# Patient Record
Sex: Female | Born: 1955 | Race: White | Hispanic: No | Marital: Married | Smoking: Never smoker
Health system: Southern US, Community
[De-identification: ages and names within clinical notes are randomized; demographics above are authoritative.]

## PROBLEM LIST (undated history)

## (undated) DIAGNOSIS — R600 Localized edema: Secondary | ICD-10-CM

## (undated) DIAGNOSIS — E669 Obesity, unspecified: Secondary | ICD-10-CM

## (undated) DIAGNOSIS — S52122A Displaced fracture of head of left radius, initial encounter for closed fracture: Secondary | ICD-10-CM

## (undated) DIAGNOSIS — L570 Actinic keratosis: Secondary | ICD-10-CM

## (undated) DIAGNOSIS — R0602 Shortness of breath: Secondary | ICD-10-CM

## (undated) DIAGNOSIS — Z8719 Personal history of other diseases of the digestive system: Secondary | ICD-10-CM

## (undated) DIAGNOSIS — S52022A Displaced fracture of olecranon process without intraarticular extension of left ulna, initial encounter for closed fracture: Secondary | ICD-10-CM

## (undated) DIAGNOSIS — M2550 Pain in unspecified joint: Secondary | ICD-10-CM

## (undated) DIAGNOSIS — M549 Dorsalgia, unspecified: Secondary | ICD-10-CM

## (undated) DIAGNOSIS — R002 Palpitations: Secondary | ICD-10-CM

## (undated) DIAGNOSIS — F419 Anxiety disorder, unspecified: Secondary | ICD-10-CM

## (undated) DIAGNOSIS — Z8489 Family history of other specified conditions: Secondary | ICD-10-CM

## (undated) DIAGNOSIS — I10 Essential (primary) hypertension: Secondary | ICD-10-CM

## (undated) DIAGNOSIS — T7840XA Allergy, unspecified, initial encounter: Secondary | ICD-10-CM

## (undated) DIAGNOSIS — M1990 Unspecified osteoarthritis, unspecified site: Secondary | ICD-10-CM

## (undated) DIAGNOSIS — G4730 Sleep apnea, unspecified: Secondary | ICD-10-CM

## (undated) DIAGNOSIS — M1711 Unilateral primary osteoarthritis, right knee: Secondary | ICD-10-CM

## (undated) DIAGNOSIS — K219 Gastro-esophageal reflux disease without esophagitis: Secondary | ICD-10-CM

## (undated) HISTORY — DX: Actinic keratosis: L57.0

## (undated) HISTORY — PX: CHOLECYSTECTOMY: SHX55

## (undated) HISTORY — DX: Obesity, unspecified: E66.9

## (undated) HISTORY — DX: Essential (primary) hypertension: I10

## (undated) HISTORY — DX: Pain in unspecified joint: M25.50

## (undated) HISTORY — DX: Allergy, unspecified, initial encounter: T78.40XA

## (undated) HISTORY — PX: TUBAL LIGATION: SHX77

## (undated) HISTORY — PX: BREAST SURGERY: SHX581

## (undated) HISTORY — PX: ABDOMINAL HYSTERECTOMY: SHX81

## (undated) HISTORY — DX: Localized edema: R60.0

## (undated) HISTORY — PX: BREAST BIOPSY: SHX20

## (undated) HISTORY — PX: COLONOSCOPY: SHX174

## (undated) HISTORY — DX: Gastro-esophageal reflux disease without esophagitis: K21.9

## (undated) HISTORY — DX: Shortness of breath: R06.02

## (undated) HISTORY — DX: Anxiety disorder, unspecified: F41.9

## (undated) HISTORY — DX: Dorsalgia, unspecified: M54.9

---

## 2001-11-06 ENCOUNTER — Encounter: Admission: RE | Admit: 2001-11-06 | Discharge: 2001-11-06 | Payer: Self-pay | Admitting: Urology

## 2001-11-06 ENCOUNTER — Encounter: Payer: Self-pay | Admitting: Urology

## 2001-11-21 HISTORY — PX: BREAST EXCISIONAL BIOPSY: SUR124

## 2004-10-26 ENCOUNTER — Ambulatory Visit: Payer: Self-pay | Admitting: General Surgery

## 2004-10-27 ENCOUNTER — Ambulatory Visit: Payer: Self-pay | Admitting: Family Medicine

## 2004-11-03 ENCOUNTER — Ambulatory Visit: Payer: Self-pay | Admitting: Family Medicine

## 2005-02-22 ENCOUNTER — Ambulatory Visit: Payer: Self-pay | Admitting: Family Medicine

## 2005-06-28 ENCOUNTER — Ambulatory Visit: Payer: Self-pay | Admitting: Family Medicine

## 2005-07-14 ENCOUNTER — Ambulatory Visit: Payer: Self-pay | Admitting: Family Medicine

## 2005-07-15 ENCOUNTER — Ambulatory Visit: Payer: Self-pay | Admitting: Family Medicine

## 2005-07-19 ENCOUNTER — Ambulatory Visit: Payer: Self-pay | Admitting: Family Medicine

## 2005-10-21 HISTORY — PX: UPPER GASTROINTESTINAL ENDOSCOPY: SHX188

## 2005-11-01 ENCOUNTER — Ambulatory Visit: Payer: Self-pay | Admitting: Internal Medicine

## 2005-11-17 ENCOUNTER — Ambulatory Visit: Payer: Self-pay | Admitting: Internal Medicine

## 2005-11-17 ENCOUNTER — Encounter (INDEPENDENT_AMBULATORY_CARE_PROVIDER_SITE_OTHER): Payer: Self-pay | Admitting: Specialist

## 2005-11-21 ENCOUNTER — Encounter (INDEPENDENT_AMBULATORY_CARE_PROVIDER_SITE_OTHER): Payer: Self-pay | Admitting: Internal Medicine

## 2005-11-21 LAB — CONVERTED CEMR LAB: Pap Smear: NORMAL

## 2005-12-29 ENCOUNTER — Ambulatory Visit: Payer: Self-pay | Admitting: Internal Medicine

## 2006-01-03 ENCOUNTER — Ambulatory Visit: Payer: Self-pay | Admitting: General Surgery

## 2006-01-25 ENCOUNTER — Ambulatory Visit: Payer: Self-pay | Admitting: Family Medicine

## 2006-05-05 ENCOUNTER — Ambulatory Visit: Payer: Self-pay | Admitting: Internal Medicine

## 2007-02-09 ENCOUNTER — Encounter (INDEPENDENT_AMBULATORY_CARE_PROVIDER_SITE_OTHER): Payer: Self-pay | Admitting: Internal Medicine

## 2007-02-09 DIAGNOSIS — M479 Spondylosis, unspecified: Secondary | ICD-10-CM | POA: Insufficient documentation

## 2007-02-12 DIAGNOSIS — I10 Essential (primary) hypertension: Secondary | ICD-10-CM | POA: Insufficient documentation

## 2007-04-13 ENCOUNTER — Telehealth (INDEPENDENT_AMBULATORY_CARE_PROVIDER_SITE_OTHER): Payer: Self-pay | Admitting: Internal Medicine

## 2007-07-12 ENCOUNTER — Encounter (INDEPENDENT_AMBULATORY_CARE_PROVIDER_SITE_OTHER): Payer: Self-pay | Admitting: Internal Medicine

## 2007-08-30 ENCOUNTER — Ambulatory Visit: Payer: Self-pay | Admitting: Family Medicine

## 2007-09-11 ENCOUNTER — Ambulatory Visit: Payer: Self-pay | Admitting: Family Medicine

## 2007-10-16 ENCOUNTER — Telehealth: Payer: Self-pay | Admitting: Family Medicine

## 2007-10-16 ENCOUNTER — Encounter (INDEPENDENT_AMBULATORY_CARE_PROVIDER_SITE_OTHER): Payer: Self-pay | Admitting: *Deleted

## 2007-10-16 ENCOUNTER — Ambulatory Visit: Payer: Self-pay | Admitting: Family Medicine

## 2007-10-16 DIAGNOSIS — K449 Diaphragmatic hernia without obstruction or gangrene: Secondary | ICD-10-CM | POA: Insufficient documentation

## 2008-03-11 ENCOUNTER — Telehealth (INDEPENDENT_AMBULATORY_CARE_PROVIDER_SITE_OTHER): Payer: Self-pay | Admitting: Internal Medicine

## 2008-03-18 ENCOUNTER — Encounter: Payer: Self-pay | Admitting: Family Medicine

## 2008-08-05 ENCOUNTER — Ambulatory Visit: Payer: Self-pay | Admitting: Family Medicine

## 2008-08-05 DIAGNOSIS — L02419 Cutaneous abscess of limb, unspecified: Secondary | ICD-10-CM | POA: Insufficient documentation

## 2008-08-05 DIAGNOSIS — L03119 Cellulitis of unspecified part of limb: Secondary | ICD-10-CM

## 2008-09-02 ENCOUNTER — Ambulatory Visit: Payer: Self-pay | Admitting: Family Medicine

## 2008-10-24 ENCOUNTER — Telehealth (INDEPENDENT_AMBULATORY_CARE_PROVIDER_SITE_OTHER): Payer: Self-pay | Admitting: *Deleted

## 2008-11-04 ENCOUNTER — Ambulatory Visit: Payer: Self-pay | Admitting: Family Medicine

## 2008-11-19 ENCOUNTER — Ambulatory Visit: Payer: Self-pay | Admitting: Family Medicine

## 2008-11-21 ENCOUNTER — Ambulatory Visit: Payer: Self-pay | Admitting: Family Medicine

## 2008-11-24 ENCOUNTER — Ambulatory Visit: Payer: Self-pay | Admitting: Family Medicine

## 2008-11-27 ENCOUNTER — Telehealth: Payer: Self-pay | Admitting: Family Medicine

## 2008-12-01 ENCOUNTER — Telehealth: Payer: Self-pay | Admitting: Family Medicine

## 2008-12-23 ENCOUNTER — Telehealth: Payer: Self-pay | Admitting: Family Medicine

## 2009-02-10 ENCOUNTER — Ambulatory Visit: Payer: Self-pay | Admitting: Family Medicine

## 2009-02-10 DIAGNOSIS — J069 Acute upper respiratory infection, unspecified: Secondary | ICD-10-CM | POA: Insufficient documentation

## 2009-02-25 ENCOUNTER — Encounter: Payer: Self-pay | Admitting: Family Medicine

## 2009-03-06 ENCOUNTER — Telehealth (INDEPENDENT_AMBULATORY_CARE_PROVIDER_SITE_OTHER): Payer: Self-pay | Admitting: Internal Medicine

## 2009-07-15 ENCOUNTER — Telehealth: Payer: Self-pay | Admitting: Family Medicine

## 2009-09-30 ENCOUNTER — Encounter: Payer: Self-pay | Admitting: Family Medicine

## 2010-05-06 ENCOUNTER — Ambulatory Visit: Payer: Self-pay | Admitting: Family Medicine

## 2010-05-10 ENCOUNTER — Ambulatory Visit: Payer: Self-pay | Admitting: Family Medicine

## 2010-05-10 ENCOUNTER — Telehealth: Payer: Self-pay | Admitting: Family Medicine

## 2010-05-12 ENCOUNTER — Ambulatory Visit: Payer: Self-pay | Admitting: Unknown Physician Specialty

## 2010-05-20 ENCOUNTER — Encounter: Payer: Self-pay | Admitting: Family Medicine

## 2010-05-26 ENCOUNTER — Encounter: Payer: Self-pay | Admitting: Family Medicine

## 2010-05-26 DIAGNOSIS — N6009 Solitary cyst of unspecified breast: Secondary | ICD-10-CM

## 2010-06-02 ENCOUNTER — Ambulatory Visit: Payer: Self-pay | Admitting: General Surgery

## 2010-06-23 ENCOUNTER — Encounter (INDEPENDENT_AMBULATORY_CARE_PROVIDER_SITE_OTHER): Payer: Self-pay | Admitting: *Deleted

## 2010-08-26 ENCOUNTER — Ambulatory Visit: Payer: Self-pay | Admitting: Family Medicine

## 2010-08-26 DIAGNOSIS — M62838 Other muscle spasm: Secondary | ICD-10-CM | POA: Insufficient documentation

## 2010-08-26 DIAGNOSIS — H6980 Other specified disorders of Eustachian tube, unspecified ear: Secondary | ICD-10-CM

## 2010-09-30 ENCOUNTER — Telehealth: Payer: Self-pay | Admitting: Family Medicine

## 2010-11-23 ENCOUNTER — Encounter: Payer: Self-pay | Admitting: Internal Medicine

## 2010-12-21 NOTE — Letter (Signed)
Summary: Nadara Eaton letter  Jessie at [facility]  [address] [place], [place] [address]   Phone: [phone]  Fax: [phone]       06/23/2010 MRN: [id number]  AMRAN MALTER [address] [place], [place]  [address]  Dear Ms. [facility], and [facility] announce the retirement of Arta Silence, M.D., from full-time practice at the [facility] office effective May 20, 2010 and his plans of returning part-time.  It is important to Dr. Hetty Ely and to our practice that you understand that [facility] has seven physicians in our office for your health care needs.  We will continue to offer the same exceptional care that you have today.    Dr. Hetty Ely has spoken to many of you about his plans for retirement and returning part-time in the fall.   We will continue to work with you through the transition to schedule appointments for you in the office and meet the high standards that [facility] is committed to.   Again, it is with great pleasure that we share the news that Dr. Hetty Ely will return to [facility] at [facility] in October of 2011 with a reduced schedule.    If you have any questions, or would like to request an appointment with one of our physicians, please call us at [phone] and press the option for Scheduling an appointment.  We take pleasure in providing you with excellent patient care and look forward to seeing you at your next office visit.  Our [facility] Physicians are:  Tillman Abide, M.D. Laurita Quint, M.D. Roxy Manns, M.D. Kerby Nora, M.D. Hannah Beat, M.D. Ruthe Mannan, M.D. We proudly welcomed Raechel Ache, M.D. and Eustaquio Boyden, M.D. to the practice in July/August 2011.  Sincerely,  [facility]

## 2010-12-21 NOTE — Miscellaneous (Signed)
  Clinical Lists Changes  Problems: Added new problem of BREAST CYST (ICD-610.0) - aspiration 2010 per Dr. Lemar Livings
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
## 2010-12-21 NOTE — Progress Notes (Signed)
Summary: Post nasal drip  Phone Note Call from Patient Call back at Home Phone [phone]   Caller: Patient Call For: Ruthe Mannan, M.D. Summary of Call: I am experiencing a lot of post nasal drip (more like flood) since Sunday p.m.  It is causing me to cough.  There is no fever and I don't really feel bad except a little tired.  I think that's probably from my sleep being interrupted with coughing.    My concern is that in the past, this has been the path that usually progresses to laryngitis over a period of time.  I have been taking Allegra 180 mg. since Sunday.  I took some Sudafed yesterday and I think it made it worse.  Do you have any suggestions about what I can do to try to dry up this post-nasal drip so that it doesn't irritate my larynx so bad that it causes laryngitis?  Once I get a bad case of it, it usually takes my voice away for a solid week with gradual improvement after that.  I don't want to be out of work again for laryngitis. Initial call taken by: Delilah Shan CMA Duncan Dull),  September 30, 2010 8:19 AM  Follow-up for Phone Call        I [employer] [facility].  I remember your Laryngitis last time.  You really did have a tough time with it. I like Allegra very much, is it Careers adviser D?  The D component will likely help a lot.  Have you used an inhaled steroid like flonase? Ruthe Mannan MD  September 30, 2010 8:24 AM  It is not Allegra D.  I used some Sudafed yesterday and it seemed to make it worse.  I do have an Astelin NS that was given to me by [facility] before she left.  Should I use it and Allegra D? Esther Teale CMA Duncan Dull)  September 30, 2010 9:42 AM   Additional Follow-up for Phone Call Additional follow up Details #1::        yes that's what I would do. Ruthe Mannan MD  September 30, 2010 9:47 AM  Thank you so much. Additional Follow-up by: Delilah Shan CMA Duncan Dull),  September 30, 2010 9:59 AM

## 2010-12-21 NOTE — Miscellaneous (Signed)
Summary: tdap  Clinical Lists Changes       Tetanus/Td Vaccine    Vaccine Type: Tdap    Site: left deltoid    Mfr: Gl[employer]oSmithKline    Dose: 0.5 ml    Route: IM    Given by: Mervin Hack CMA (AAMA)    Exp. Date: 02/13/2012    Lot #: [id number]    VIS given: 10/09/07 version given May 03, 2010.

## 2010-12-21 NOTE — Progress Notes (Signed)
Summary: Tetanus Td no charge per Employee Health  Phone Note From Other Clinic   Caller: Provider Summary of Call: Employee Health told [name] she needed a Tetanus/Td Vaccine. [name],  administrated the injecttions to [name].  This is a no charge to the employee per [employer].  Need to enter [facility] as a no charge.[name]  May 10, 2010 3:38 PM Initial call taken by: [name],  May 10, 2010 3:38 PM  Follow-up for Phone Call        Request was sent to Dr. Hetty Ely to enter the No Charge.[name]  May 11, 2010 8:57 AM Follow-up by: [name],  May 11, 2010 8:57 AM  Additional Follow-up for Phone Call Additional follow up Details #1::        This was done. Additional Follow-up by: [name] MD,  May 11, 2010 9:05 AM

## 2010-12-21 NOTE — Letter (Signed)
Summary: [facility]  [facility]   Imported By: Lanelle Bal 06/01/2010 09:22:34  _____________________________________________________________________  External Attachment:    Type:   Image     Comment:   External Document

## 2010-12-23 NOTE — Letter (Signed)
Summary: Office Visit Letter  [facility]  [address]   [place], [place] [address]   Phone: [phone]  Fax: [phone]      November 23, 2010 MRN: [id number]   [name] [address] [place], [place]  [address]   Dear [name],   According to our records, it is time for you to schedule a follow-up office visit with [place].   At your convenience, please call [phone] (option #2)to schedule an office visit. If you have any questions, concerns, or feel that this letter is in error, we would appreciate your call.   Sincerely,   Stan Head, M.D.  [facility] [phone]

## 2011-01-31 ENCOUNTER — Ambulatory Visit (INDEPENDENT_AMBULATORY_CARE_PROVIDER_SITE_OTHER): Payer: BC Managed Care – PPO | Admitting: Family Medicine

## 2011-01-31 ENCOUNTER — Encounter: Payer: Self-pay | Admitting: Family Medicine

## 2011-01-31 DIAGNOSIS — H6980 Other specified disorders of Eustachian tube, unspecified ear: Secondary | ICD-10-CM

## 2011-01-31 DIAGNOSIS — H6990 Unspecified Eustachian tube disorder, unspecified ear: Secondary | ICD-10-CM

## 2011-02-08 NOTE — Assessment & Plan Note (Signed)
Summary: Ear problems /lsf   Vital Signs:  Patient profile:   55 year old female Height:      65 inches Temp:     98.7 degrees F oral Pulse rate:   84 / minute Pulse rhythm:   regular Resp:     16 per minute BP sitting:   120 / 74  (right arm) Cuff size:   large  Vitals Entered By: Benny Lennert CMA Alexanderia Gorby Dull) (January 31, 2011 11:03 AM) CC: Ear pain, worse on the right, some post nasal drainage.   History of Present Illness: R ear pain since yesterday.  Better with local heat last night.  Was taking ibuprofen prev.  Didn't use ear drops.  Occ mild, intermittent L ear pain.  No FCNAVD.  Some head congestion- occ flare of this per patient.  No cough.  No wheeze. Hasn't been using nasal saline recently.    Allergies: 1)  ! * Wheat  Review of Systems       See HPI.  Otherwise negative.    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  This is likely causing her symptoms.  Her ear/TM looks fine o/w.  Nasal saline and claritin are reasonable and then consider flonase if not improved after that.  She agrees, follow up as needed.   Complete Medication List: 1)  Atenolol 100 Mg Tabs (Atenolol) .... Take 1 tablet by mouth once a day 2)  Neurontin 300 Mg Caps (Gabapentin) .... Take 1 capsule by mouth three times a day as needed 3)  Prilosec 20 Mg Cpdr (Omeprazole) .... Take 1 tab by mouth at bedtime as needed 4)  Flax Seed Oil 1000 Mg Caps (Flaxseed (linseed)) .... Take 1 capsule by mouth two times a day 5)  Acidophilus 100 Mg Caps (Lactobacillus) .... Take 1 capsule by mouth once a day 6)  Caltrate 600+d 600-400 Mg-unit Tabs (Calcium carbonate-vitamin d) .... Take 1 tablet by mouth two times a day 7)  Adult Aspirin Ec Low Strength  81 Mg Tbec (Aspirin) .... Take 2  tablets  by mouth at bedtime 8)  Vitamin A 10,000 Units  .... Take 1 capsule by mouth at bedtime 9)  B Complex Caps (B complex vitamins) .... Take one capsule each morning. 10)  Bl Vitamin C 500 Mg Tabs (Ascorbic acid) .... Take 1 capsule by mouth at bedtime 11)  Cyclobenzaprine Hcl 10 Mg Tabs (Cyclobenzaprine hcl) .Marland Kitchen.. 1 by mouth 2 times daily as needed for neck pain 12)  Lisinopril-hydrochlorothiazide 20-12.5 Mg Tabs (Lisinopril-hydrochlorothiazide) .... Take 1 tablet by mouth once a day 13)  Vitamin D 1000 Unit Tabs (Cholecalciferol) .... 5,000 units each morning.  Patient Instructions: 1)  I would use nasal saline and 10mg  of claritin a day.  If you aren't getting better, let me know.  Take care.    Orders Added: 1)  Est. Patient Level III [28413]    Current Allergies (reviewed today): ! * WHEAT

## 2011-03-03 ENCOUNTER — Ambulatory Visit (INDEPENDENT_AMBULATORY_CARE_PROVIDER_SITE_OTHER): Payer: BC Managed Care – PPO | Admitting: Family Medicine

## 2011-03-03 ENCOUNTER — Encounter: Payer: Self-pay | Admitting: Family Medicine

## 2011-03-03 VITALS — BP 130/82 | HR 76 | Temp 97.9°F | Resp 16 | Ht 65.0 in

## 2011-03-03 DIAGNOSIS — N390 Urinary tract infection, site not specified: Secondary | ICD-10-CM

## 2011-03-03 DIAGNOSIS — R309 Painful micturition, unspecified: Secondary | ICD-10-CM

## 2011-03-03 LAB — POCT URINALYSIS DIPSTICK
Bilirubin, UA: NEGATIVE
Glucose, UA: NEGATIVE
Ketones, UA: NEGATIVE
Nitrite, UA: POSITIVE
Protein, UA: NEGATIVE
Spec Grav, UA: 1.025
Urobilinogen, UA: NEGATIVE
pH, UA: 6

## 2011-03-03 MED ORDER — CIPROFLOXACIN HCL 500 MG PO TABS: 250.0000 mg | ORAL_TABLET | Freq: Two times a day (BID) | ORAL | Status: AC

## 2011-03-03 NOTE — Progress Notes (Signed)
SUBJECTIVE: [name] is a 55 y.o. female who complains of urinary frequency, urgency and dysuria x 1 days, without flank pain, fever, chills, or abnormal vaginal discharge or bleeding.   OBJECTIVE: Appears well, in no apparent distress.  Vital signs are normal. The abdomen is soft without tenderness, guarding, mass, rebound or organomegaly. No CVA tenderness or inguinal adenopathy noted. Urine dipstick shows positive for nitrates and positive for leukocytes.  Micro exam: not done.   ASSESSMENT: UTI uncomplicated without evidence of pyelonephritis  PLAN: Treatment per orders - also push fluids, may use Pyridium OTC prn. Call or return to clinic prn if these symptoms worsen or fail to improve as anticipated.

## 2011-03-09 ENCOUNTER — Ambulatory Visit: Payer: BC Managed Care – PPO | Admitting: Family Medicine

## 2011-03-28 ENCOUNTER — Encounter: Payer: Self-pay | Admitting: Family Medicine

## 2011-03-28 ENCOUNTER — Ambulatory Visit (INDEPENDENT_AMBULATORY_CARE_PROVIDER_SITE_OTHER): Payer: BC Managed Care – PPO | Admitting: Family Medicine

## 2011-03-28 VITALS — BP 142/82 | HR 80 | Temp 98.5°F

## 2011-03-28 DIAGNOSIS — L0390 Cellulitis, unspecified: Secondary | ICD-10-CM

## 2011-03-28 DIAGNOSIS — L0291 Cutaneous abscess, unspecified: Secondary | ICD-10-CM

## 2011-03-28 MED ORDER — SULFAMETHOXAZOLE-TRIMETHOPRIM 800-160 MG PO TABS: 1.0000 | ORAL_TABLET | Freq: Two times a day (BID) | ORAL | Status: AC

## 2011-03-28 MED ORDER — FLUCONAZOLE 150 MG PO TABS: 150.0000 mg | ORAL_TABLET | Freq: Once | ORAL | Status: AC

## 2011-03-28 NOTE — Progress Notes (Signed)
55 yo with 2-3 days of abscess in right groin area.  First noticed it Saturday morning. No erythema, redness or pain. Placed warm compresses it on it and took warm baths, was able to express approximately a thimble full of clear/bloody fluid from it in the shower that day.  Still not noticing any erythema and it only "stings" a little. No fevers, chills, nausea or body aches.  Never had an abscess like this before but did have a small pimple on her labia recently.  Noticed that there is a firm area below this abscess.  The PMH, PSH, Social History, Family History, Medications, and allergies have been reviewed in [facility], and have been updated if relevant.  ROS: See HPI Patient reports no vision/ hearing  changes, adenopathy,fever, weight change,  persistant / recurrent hoarseness , swallowing issues, chest pain,palpitations,edema,persistant /recurrent cough, hemoptysis, dyspnea( rest/ exertional/paroxysmal nocturnal), gastrointestinal bleeding(melena, rectal bleeding), abdominal pain, significant heartburn boel changes,GU symptoms(dysuria, hematuria,pyuria, incontinence) ), Gyn symptoms(abnormal  bleeding , pain),  syncope, focal weakness,  memory loss,numbness & tingling, skin/hair /nail changes,abnormal bruising or bleeding, anxiety,or depression.  Physical exam: BP 142/82  Pulse 80  Temp(Src) 98.5 F (36.9 C) (Oral) Gen:  Alert, very pleasant, NAD Skin:  2 cm non fluctuant, non erythematous abscess, no warmth or surrounding erythema. Does have a firm, 4 cm area below it.  Non painful to palp.

## 2011-03-28 NOTE — Assessment & Plan Note (Signed)
New. Will place on Bactrim DS twice daily x 10 days. Continue warm soaks and compresses. Follow up if no improvement of symptoms or if she develops fevers, redness or other worsening signs of infection.

## 2011-04-12 ENCOUNTER — Ambulatory Visit: Payer: Self-pay

## 2011-04-12 ENCOUNTER — Other Ambulatory Visit: Payer: Self-pay | Admitting: Occupational Medicine

## 2011-04-12 DIAGNOSIS — R52 Pain, unspecified: Secondary | ICD-10-CM

## 2011-04-13 ENCOUNTER — Encounter (HOSPITAL_BASED_OUTPATIENT_CLINIC_OR_DEPARTMENT_OTHER)
Admission: RE | Admit: 2011-04-13 | Discharge: 2011-04-13 | Disposition: A | Discharge status: Home / Self Care | Payer: PRIVATE HEALTH INSURANCE | Source: Ambulatory Visit | Attending: Orthopedic Surgery | Admitting: Orthopedic Surgery

## 2011-04-13 LAB — BASIC METABOLIC PANEL
CO2: 30 mEq/L (ref 19–32)
Calcium: 9.3 mg/dL (ref 8.4–10.5)
Chloride: 102 mEq/L (ref 96–112)
GFR calc Af Amer: >60 mL/min (ref >60)
Sodium: 142 mEq/L (ref 135–145)

## 2011-04-14 ENCOUNTER — Ambulatory Visit (HOSPITAL_BASED_OUTPATIENT_CLINIC_OR_DEPARTMENT_OTHER)
Admission: RE | Admit: 2011-04-14 | Discharge: 2011-04-14 | Disposition: A | Discharge status: Home / Self Care | Payer: PRIVATE HEALTH INSURANCE | Source: Ambulatory Visit | Attending: Orthopedic Surgery | Admitting: Orthopedic Surgery

## 2011-04-14 DIAGNOSIS — I10 Essential (primary) hypertension: Secondary | ICD-10-CM | POA: Insufficient documentation

## 2011-04-14 DIAGNOSIS — E669 Obesity, unspecified: Secondary | ICD-10-CM | POA: Insufficient documentation

## 2011-04-14 DIAGNOSIS — Y9269 Other specified industrial and construction area as the place of occurrence of the external cause: Secondary | ICD-10-CM | POA: Insufficient documentation

## 2011-04-14 DIAGNOSIS — Y998 Other external cause status: Secondary | ICD-10-CM | POA: Insufficient documentation

## 2011-04-14 DIAGNOSIS — W07XXXA Fall from chair, initial encounter: Secondary | ICD-10-CM | POA: Insufficient documentation

## 2011-04-14 DIAGNOSIS — K219 Gastro-esophageal reflux disease without esophagitis: Secondary | ICD-10-CM | POA: Insufficient documentation

## 2011-04-14 DIAGNOSIS — S52599A Other fractures of lower end of unspecified radius, initial encounter for closed fracture: Secondary | ICD-10-CM | POA: Insufficient documentation

## 2011-04-14 HISTORY — PX: WRIST FRACTURE SURGERY: SHX121

## 2011-04-19 NOTE — Op Note (Signed)
[name], [name] NO.:  [id number]  MEDICAL RECORD NO.:  [id number]           PATIENT TYPE:  LOCATION:                                 FACILITY:  PHYSICIAN:  Betha Loa, MD             DATE OF BIRTH:  DATE OF PROCEDURE:  04/14/2011 DATE OF DISCHARGE:                              OPERATIVE REPORT   PREOPERATIVE DIAGNOSIS:  Left distal radius comminuted intra-articular fracture.  POSTOPERATIVE DIAGNOSIS:  Left distal radius comminuted intra-articular fracture.  PROCEDURE:  Open reduction and internal fixation left comminuted intra- articular distal radius fracture.  SURGEON:  Betha Loa, MD  ASSISTANT:  Jonni Sanger, PA  ANESTHESIA:  General with regional.  IV FLUIDS:  Per anesthesia flow sheet.  ESTIMATED BLOOD LOSS:  Minimal.  COMPLICATIONS:  None.  SPECIMENS:  None.  TOURNIQUET TIME:  80 minutes.  DISPOSITION:  Stable to PACU.  INDICATIONS:  Ms. [name] is a 55 year old white female who 2 days ago fell off a stool while at work onto her left arm.  Radiographs were taken revealing comminuted intra-articular fracture of the distal radius.  She was placed in a sugar-tong splint, followed up with me in the office.  On evaluation, she had intact sensation and capillary refill in all fingertips.  She is on a well-fitting sugar-tong splint. Radiographs showed a comminuted intra-articular distal radius fracture with displacement.  I have recommended Ms. [name] going to the operating room for open reduction and internal fixation of the distal radius fracture.  We discussed the risks, benefits, and alternatives of the surgery including the risk of blood loss, infection, damage to nerves, vessels, tendons, ligaments, and bone, failure of surgery, need for additional surgery, complications with wound healing, continued pain, nonunion, malunion stiffness.  She voiced understanding of these risks and elected to proceed.  OPERATIVE COURSE:   After being identified preoperatively by myself, the patient and I agreed upon procedure and site of procedure.  The surgical site was marked.  Risks, benefits, and alternatives of surgery were reviewed, and she wished to proceed.  She was given 1 g of IV Ancef as preoperative antibiotic prophylaxis.  Surgical consent had been signed. A regional block was performed by Anesthesia in preoperative holding. She was transferred to the operating room and placed on the operating room table in supine position with left upper extremity on arm board. General anesthesia was induced by the anesthesiologist.  Left upper extremity was prepped and draped in normal sterile orthopedic fashion. A surgical pause was performed between the surgeons, Anesthesia, and operating room staff, and all were in agreement with the patient's procedure and site of procedure.  Tourniquet in the proximal aspect of the extremity was inflated to 275 mmHg after exsanguination of limb with an Esmarch bandage.  Standard volar [name] approach was used. Superficial and deep portions of the FCR tendon sheath were incised sharply.  The FCR was retracted ulnarly to protect the palmar cutaneous branch of the median nerve.  FPL was retracted ulnarly as well.  The pronator quadratus was released from the radial side of the  radius using bipolar electrocautery and elevated from the radius using a periosteal elevator.  Brachioradialis was released.  The fracture site was easily identified.  It was cleared of soft tissue and clot.  Reduction was performed.  There was noted to be some bone loss from impaction.  C-arm was used in AP, lateral, and oblique projections to aid in reduction. Cancellous bone chips were used to fill up the bone void.  Once the chips were placed, adequate reduction was obtained.  A volar distal radial locking plate from the Acumed set was selected.  It was checked on C-arm to ensure appropriate position and size,  which was the case. The guide pins were used to stabilize the plate and ensure appropriate position, which was the case.  The standard AO drilling measuring technique was used.  A screw was placed in the slotted hole in the shaft of the plate.  The distal row of screw holes was filled with smooth locking pegs.  C-arm was used in AP, lateral, and oblique projections to ensure appropriate reduction.  It was noted that the plate was slightly off from the bone.  The pegs were removed, and the fracture was re- reduced.  The pegs were replaced.  The reduction was better against the plate though there was still slight gap.  It was felt that this was acceptable.  The remaining lunate-sided distal hole was filled with a smooth peg.  The two radial styloid holes were filled with locking screws.  The remaining two holes in the shaft of the plate were filled again using standard AO drilling measuring technique.  C-arm was used in AP, lateral, and oblique projections to ensure appropriate reduction and placement of hardware, which was the case.  There was no intra-articular penetration.  The wound was copiously irrigated with 1000 mL of sterile saline.  The pronator quadratus was repaired back over top of the plate as best possible using 3-0 Vicryl suture.  A single inverted interrupted subcutaneous stitch was placed to aid in wound closure.  The skin was closed using 4-0 nylon in a horizontal mattress fashion.  The wound was dressed with sterile Xeroform and 4 x 4s and wrapped with Kerlix.  A volar splint was placed and wrapped with Kerlix and Ace bandage. Tourniquet was deflated 80 minutes.  The fingertips were pink with brisk capillary refill after deflation of the tourniquet.  The operative drapes were broken down, and the patient was awoken from anesthesia safely.  She was transferred back to stretcher and taken to PACU in stable condition.  I will see her back in the office in 1 week  for postoperative followup.  I will give her Norco 5/325 one to two p.o. q.6 h. p.r.n. pain, dispensed #50.     Betha Loa, MD     KK/MEDQ  D:  04/14/2011  T:  04/15/2011  Job:  [id number]  Electronically Signed by Betha Loa  on 04/19/2011 04:32:22 PM

## 2011-05-03 ENCOUNTER — Ambulatory Visit: Payer: Worker's Compensation | Attending: Orthopedic Surgery | Admitting: Occupational Therapy

## 2011-05-03 ENCOUNTER — Ambulatory Visit: Payer: Worker's Compensation | Admitting: Physical Therapy

## 2011-05-03 DIAGNOSIS — M25649 Stiffness of unspecified hand, not elsewhere classified: Secondary | ICD-10-CM | POA: Insufficient documentation

## 2011-05-03 DIAGNOSIS — IMO0001 Reserved for inherently not codable concepts without codable children: Secondary | ICD-10-CM | POA: Insufficient documentation

## 2011-05-03 DIAGNOSIS — M25549 Pain in joints of unspecified hand: Secondary | ICD-10-CM | POA: Insufficient documentation

## 2011-05-04 ENCOUNTER — Ambulatory Visit: Payer: Worker's Compensation | Admitting: Occupational Therapy

## 2011-05-10 ENCOUNTER — Ambulatory Visit: Payer: Worker's Compensation | Admitting: Occupational Therapy

## 2011-05-13 ENCOUNTER — Ambulatory Visit: Payer: Worker's Compensation | Admitting: Occupational Therapy

## 2011-05-17 ENCOUNTER — Ambulatory Visit: Payer: Worker's Compensation | Admitting: Occupational Therapy

## 2011-05-23 ENCOUNTER — Ambulatory Visit: Payer: PRIVATE HEALTH INSURANCE | Attending: Orthopedic Surgery | Admitting: Occupational Therapy

## 2011-05-23 DIAGNOSIS — IMO0001 Reserved for inherently not codable concepts without codable children: Secondary | ICD-10-CM | POA: Insufficient documentation

## 2011-05-23 DIAGNOSIS — M25649 Stiffness of unspecified hand, not elsewhere classified: Secondary | ICD-10-CM | POA: Insufficient documentation

## 2011-05-23 DIAGNOSIS — M25549 Pain in joints of unspecified hand: Secondary | ICD-10-CM | POA: Insufficient documentation

## 2011-05-27 ENCOUNTER — Ambulatory Visit: Payer: PRIVATE HEALTH INSURANCE | Admitting: Occupational Therapy

## 2011-05-31 ENCOUNTER — Ambulatory Visit: Payer: PRIVATE HEALTH INSURANCE | Admitting: Occupational Therapy

## 2011-06-02 ENCOUNTER — Ambulatory Visit: Payer: PRIVATE HEALTH INSURANCE | Admitting: Occupational Therapy

## 2011-06-03 ENCOUNTER — Other Ambulatory Visit (HOSPITAL_COMMUNITY): Payer: Self-pay | Admitting: Orthopedic Surgery

## 2011-06-03 DIAGNOSIS — S62122A Displaced fracture of lunate [semilunar], left wrist, initial encounter for closed fracture: Secondary | ICD-10-CM

## 2011-06-06 ENCOUNTER — Ambulatory Visit (HOSPITAL_COMMUNITY)
Admission: RE | Admit: 2011-06-06 | Discharge: 2011-06-06 | Disposition: A | Discharge status: Home / Self Care | Payer: PRIVATE HEALTH INSURANCE | Source: Ambulatory Visit | Attending: Orthopedic Surgery | Admitting: Orthopedic Surgery

## 2011-06-06 ENCOUNTER — Encounter (HOSPITAL_COMMUNITY): Payer: Self-pay

## 2011-06-06 DIAGNOSIS — IMO0002 Reserved for concepts with insufficient information to code with codable children: Secondary | ICD-10-CM | POA: Insufficient documentation

## 2011-06-06 DIAGNOSIS — S62122A Displaced fracture of lunate [semilunar], left wrist, initial encounter for closed fracture: Secondary | ICD-10-CM

## 2011-06-07 ENCOUNTER — Encounter: Payer: Self-pay | Admitting: Occupational Therapy

## 2011-06-09 ENCOUNTER — Ambulatory Visit: Payer: PRIVATE HEALTH INSURANCE | Admitting: Occupational Therapy

## 2011-06-14 ENCOUNTER — Ambulatory Visit: Payer: PRIVATE HEALTH INSURANCE | Admitting: Occupational Therapy

## 2011-06-16 ENCOUNTER — Ambulatory Visit: Payer: PRIVATE HEALTH INSURANCE | Admitting: Occupational Therapy

## 2011-06-21 ENCOUNTER — Ambulatory Visit: Payer: PRIVATE HEALTH INSURANCE | Admitting: Occupational Therapy

## 2011-06-23 ENCOUNTER — Encounter: Payer: Self-pay | Admitting: Occupational Therapy

## 2011-06-29 ENCOUNTER — Encounter: Payer: Self-pay | Admitting: Internal Medicine

## 2011-06-29 ENCOUNTER — Ambulatory Visit (INDEPENDENT_AMBULATORY_CARE_PROVIDER_SITE_OTHER): Payer: BC Managed Care – PPO | Admitting: Internal Medicine

## 2011-06-29 VITALS — BP 136/90 | HR 80 | Ht 66.0 in | Wt 282.0 lb

## 2011-06-29 DIAGNOSIS — Z9283 Personal history of failed moderate sedation: Secondary | ICD-10-CM

## 2011-06-29 DIAGNOSIS — Z1211 Encounter for screening for malignant neoplasm of colon: Secondary | ICD-10-CM

## 2011-06-29 DIAGNOSIS — K219 Gastro-esophageal reflux disease without esophagitis: Secondary | ICD-10-CM | POA: Insufficient documentation

## 2011-06-29 MED ORDER — FAMOTIDINE-CA CARB-MAG HYDROX 10-800-165 MG PO CHEW: 1.0000 | CHEWABLE_TABLET | Freq: Every day | ORAL | Status: DC | PRN

## 2011-06-29 NOTE — Progress Notes (Signed)
  Subjective:    Patient ID: [name], female    DOB: 27-Jan-1956, 55 y.o.   MRN: [id number]  HPI Here to discuss repeating a screening colonoscopy (optical vs. CT) as last one 5 years ago - unable to enter cecum. She did not have adequate response to moderate sedation either.  Occasional indigestion. Not using PPI regularly because of perceived diarrhea. May get indigestion or pyrosis without triggers 3-4 times a week. Caffeine use 1x a day or so. Trying to lose weight, she gained after an injury at work.   2-3 stools a day - regular post-prandial. Unchanged.     Review of Systems As above    Objective:   Physical Exam Obese, NAD       Assessment & Plan:  She has decided to try a repeat optical colonoscopy with propofol sedation re: colorectal cancer screening. Will call back to schedule.

## 2011-06-29 NOTE — Assessment & Plan Note (Signed)
Doing ok with intermittent sxs, a few times a week. I think she can use Pepcid complete prn. She needs to lose weight and is aware. Discussed other lifestyle changes also.

## 2011-06-29 NOTE — Patient Instructions (Signed)
You have been instructed by Dr. Leone Payor to have a Colonoscopy.  Please give [place] a call back in late October to schedule your Pre-visit and Colonoscopy for November. You have been taken off Omeprazole and started on Pepcid Complete as needed. GERD handout given to you today and continue to lose weight.

## 2011-06-29 NOTE — Assessment & Plan Note (Signed)
She declined dietitian Considering Weight Watchers and i endorsed To use elliptical exerciser but needs to reduce calories Advised f/u PCP to review this

## 2011-06-29 NOTE — Assessment & Plan Note (Signed)
Will use propofol - MAC sedation in fuure

## 2011-06-30 ENCOUNTER — Ambulatory Visit: Payer: BC Managed Care – PPO | Attending: Orthopedic Surgery | Admitting: Occupational Therapy

## 2011-06-30 DIAGNOSIS — M25649 Stiffness of unspecified hand, not elsewhere classified: Secondary | ICD-10-CM | POA: Insufficient documentation

## 2011-06-30 DIAGNOSIS — M25549 Pain in joints of unspecified hand: Secondary | ICD-10-CM | POA: Insufficient documentation

## 2011-06-30 DIAGNOSIS — IMO0001 Reserved for inherently not codable concepts without codable children: Secondary | ICD-10-CM | POA: Insufficient documentation

## 2011-10-24 ENCOUNTER — Other Ambulatory Visit (HOSPITAL_COMMUNITY): Payer: Self-pay | Admitting: Orthopedic Surgery

## 2011-10-24 DIAGNOSIS — M25532 Pain in left wrist: Secondary | ICD-10-CM

## 2011-10-31 ENCOUNTER — Ambulatory Visit (HOSPITAL_COMMUNITY)
Admission: RE | Admit: 2011-10-31 | Discharge: 2011-10-31 | Disposition: A | Discharge status: Home / Self Care | Payer: PRIVATE HEALTH INSURANCE | Source: Ambulatory Visit | Attending: Orthopedic Surgery | Admitting: Orthopedic Surgery

## 2011-10-31 DIAGNOSIS — IMO0001 Reserved for inherently not codable concepts without codable children: Secondary | ICD-10-CM | POA: Insufficient documentation

## 2011-10-31 DIAGNOSIS — M25532 Pain in left wrist: Secondary | ICD-10-CM

## 2011-10-31 DIAGNOSIS — M25539 Pain in unspecified wrist: Secondary | ICD-10-CM | POA: Insufficient documentation

## 2011-12-28 ENCOUNTER — Encounter: Payer: Self-pay | Admitting: Internal Medicine

## 2012-01-03 ENCOUNTER — Other Ambulatory Visit: Payer: Self-pay | Admitting: *Deleted

## 2012-01-03 MED ORDER — PSEUDOEPH-CHLORPHEN-HYDROCOD 60-4-5 MG/5ML PO SOLN: 5.0000 mL | Freq: Four times a day (QID) | ORAL | Status: DC | PRN

## 2012-01-03 NOTE — Telephone Encounter (Signed)
I have managed to get sick with a URI or bronchitis (?).  It has been brewing for about a week but really escalated yesterday.  I was running a fever last night of 101.  When I had this last year, I went to UC and was given Zutripro which really helped.  I think this is a decongestant/cough suppressant but I'm not sure.  Anyway, it really helped.  Would you be willing to prescribe it for me?  I don't see any need of being examined because this just escalated yesterday and I believe it is probably viral.  [facility].

## 2012-01-03 NOTE — Telephone Encounter (Signed)
Rx was given to patient today.

## 2012-01-12 ENCOUNTER — Encounter: Payer: Self-pay | Admitting: Family Medicine

## 2012-01-18 ENCOUNTER — Encounter: Payer: Self-pay | Admitting: Internal Medicine

## 2012-01-18 ENCOUNTER — Ambulatory Visit: Payer: Self-pay | Admitting: Family Medicine

## 2012-01-18 ENCOUNTER — Ambulatory Visit (AMBULATORY_SURGERY_CENTER): Payer: BC Managed Care – PPO | Admitting: *Deleted

## 2012-01-18 VITALS — Ht 64.0 in | Wt 212.0 lb

## 2012-01-18 DIAGNOSIS — Z1211 Encounter for screening for malignant neoplasm of colon: Secondary | ICD-10-CM

## 2012-01-18 MED ORDER — MOVIPREP 100 G PO SOLR: ORAL | Status: DC

## 2012-02-02 ENCOUNTER — Ambulatory Visit (AMBULATORY_SURGERY_CENTER): Payer: BC Managed Care – PPO | Admitting: Internal Medicine

## 2012-02-02 ENCOUNTER — Encounter: Payer: Self-pay | Admitting: Internal Medicine

## 2012-02-02 VITALS — BP 92/57 | HR 63 | Temp 96.8°F | Resp 20 | Ht 64.0 in | Wt 212.0 lb

## 2012-02-02 DIAGNOSIS — Z1211 Encounter for screening for malignant neoplasm of colon: Secondary | ICD-10-CM

## 2012-02-02 DIAGNOSIS — K5730 Diverticulosis of large intestine without perforation or abscess without bleeding: Secondary | ICD-10-CM

## 2012-02-02 DIAGNOSIS — D126 Benign neoplasm of colon, unspecified: Secondary | ICD-10-CM

## 2012-02-02 DIAGNOSIS — K648 Other hemorrhoids: Secondary | ICD-10-CM

## 2012-02-02 MED ORDER — SODIUM CHLORIDE 0.9 % IV SOLN: 500.0000 mL | INTRAVENOUS | Status: DC

## 2012-02-02 NOTE — Op Note (Addendum)
[facility] [address] [place], [place]  [address]  COLONOSCOPY PROCEDURE REPORT  PATIENT:  Erin, Knapp  MR#:  [id number] BIRTHDATE:  1955-12-28, 55 yrs. old  GENDER:  female ENDOSCOPIST:  Iva Boop, MD, [facility]  PROCEDURE DATE:  02/02/2012 PROCEDURE:  Colonoscopy with biopsy ASA CLASS:  Class III INDICATIONS:  Routine Risk Screening returned at 5 years since cecum not entered well in past MEDICATIONS:   These medications were titrated to patient response per physician's verbal order, MAC sedation, administered by CRNA, propofol (Diprivan) 300 mg IV  DESCRIPTION OF PROCEDURE:   After the risks benefits and alternatives of the procedure were thoroughly explained, informed consent was obtained.  Digital rectal exam was performed and revealed no abnormalities.   The LB 180AL [id number] endoscope was introduced through the anus and advanced to the cecum, which was identified by both the appendix and ileocecal valve, without limitations.  The quality of the prep was excellent, using MoviPrep.  The instrument was then slowly withdrawn as the colon was fully examined. <<PROCEDUREIMAGES>>  FINDINGS:  Three polyps were found. Diminutive transverse (1-11mm), 2 rectal (2-69mm) polyps removed. The polyps were removed using cold biopsy forceps.  Severe diverticulosis was found in the left colon.  This was otherwise a normal examination of the colon. Retroflexed views in the rectum revealed internal hemorrhoids. The time to cecum = 2:40 minutes. The scope was then withdrawn in 15:43 minutes from the cecum and the procedure completed. COMPLICATIONS:  None ENDOSCOPIC IMPRESSION: 1) Three diminutive polyps removed 2) Severe diverticulosis in the left colon 3) Internal hemorrhoids 4) Otherwise normal examination with excellent prep  REPEAT EXAM:  In for Colonoscopy, pending biopsy results.  Addendum: - cecum entered with suprapubic pressure in left lateral. she did not have  complete amnestic response to propofol.  Iva Boop, MD, [facility]  CC:  Dorothey Baseman, MD and The Patient  n. REVISED:  02/02/2012 11:58 AM eSIGNED:   Iva Boop at 02/02/2012 11:58 AM  Delilah Shan, [id number]

## 2012-02-02 NOTE — Patient Instructions (Addendum)
Three tiny polyps were removed. They look benign. Diverticulosis and small hemorrhoids also. Complete exam accomplished today. I will send a letter about results and timing of next colonoscopy. Iva Boop, MD, FACG YOU HAD AN ENDOSCOPIC PROCEDURE TODAY AT THE [facility]: Refer to the procedure report that was given to you for any specific questions about what was found during the examination.  If the procedure report does not answer your questions, please call your gastroenterologist to clarify.  If you requested that your care partner not be given the details of your procedure findings, then the procedure report has been included in a sealed envelope for you to review at your convenience later.  YOU SHOULD EXPECT: Some feelings of bloating in the abdomen. Passage of more gas than usual.  Walking can help get rid of the air that was put into your GI tract during the procedure and reduce the bloating. If you had a lower endoscopy (such as a colonoscopy or flexible sigmoidoscopy) you may notice spotting of blood in your stool or on the toilet paper. If you underwent a bowel prep for your procedure, then you may not have a normal bowel movement for a few days.  DIET: Your first meal following the procedure should be a light meal and then it is ok to progress to your normal diet.  A half-sandwich or bowl of soup is an example of a good first meal.  Heavy or fried foods are harder to digest and may make you feel nauseous or bloated.  Likewise meals heavy in dairy and vegetables can cause extra gas to form and this can also increase the bloating.  Drink plenty of fluids but you should avoid alcoholic beverages for 24 hours.  ACTIVITY: Your care partner should take you home directly after the procedure.  You should plan to take it easy, moving slowly for the rest of the day.  You can resume normal activity the day after the procedure however you should NOT DRIVE or use heavy machinery for 24  hours (because of the sedation medicines used during the test).    SYMPTOMS TO REPORT IMMEDIATELY: A gastroenterologist can be reached at any hour.  During normal business hours, 8:30 AM to 5:00 PM Monday through Friday, call [phone].  After hours and on weekends, please call the GI answering service at [phone] who will take a message and have the physician on call contact you.   Following lower endoscopy (colonoscopy or flexible sigmoidoscopy):  Excessive amounts of blood in the stool  Significant tenderness or worsening of abdominal pains  Swelling of the abdomen that is new, acute  Fever of 100F or higher  FOLLOW UP: If any biopsies were taken you will be contacted by phone or by letter within the next 1-3 weeks.  Call your gastroenterologist if you have not heard about the biopsies in 3 weeks.  Our staff will call the home number listed on your records the next business day following your procedure to check on you and address any questions or concerns that you may have at that time regarding the information given to you following your procedure. This is a courtesy call and so if there is no answer at the home number and we have not heard from you through the emergency physician on call, we will assume that you have returned to your regular daily activities without incident.  SIGNATURES/CONFIDENTIALITY: You and/or your care partner have signed paperwork which will be entered into your  electronic medical record.  These signatures attest to the fact that that the information above on your After Visit Summary has been reviewed and is understood.  Full responsibility of the confidentiality of this discharge information lies with you and/or your care-partner.

## 2012-02-02 NOTE — Progress Notes (Signed)
Patient did not have preoperative order for IV antibiotic SSI prophylaxis. (G8918)  Patient did not experience any of the following events: a burn prior to discharge; a fall within the facility; wrong site/side/patient/procedure/implant event; or a hospital transfer or hospital admission upon discharge from the facility. (G8907)  

## 2012-02-03 ENCOUNTER — Telehealth: Payer: Self-pay | Admitting: *Deleted

## 2012-02-03 NOTE — Telephone Encounter (Signed)
Left message to call office if questions and concerns.

## 2012-02-08 ENCOUNTER — Encounter: Payer: Self-pay | Admitting: Internal Medicine

## 2012-02-08 NOTE — Progress Notes (Signed)
Quick Note:  Hyperplastic/lymphoid - not pre-cancerous Repeat colonoscopy about 10 years 2023 ______

## 2012-05-27 IMAGING — CT CT WRIST*L* W/O CM
4 series · 16 of 33 positions shown, 19 images · non-contrast
Comparison: Plain films 04/12/2011.

CLINICAL DATA: History of fracture of the distal radius with plate
and screw fixation.  Question whether the hardware extends into the
joint space.

CT OF THE LEFT WRIST WITHOUT CONTRAST
TECHNIQUE: Multidetector CT imaging was performed according to the
standard protocol. Multiplanar CT image reconstructions were also
generated.

[Series 6: extremitywrist 2.0 (id) · axial · 0.33mm/px · z∈[-210,-122]mm · 5 of 67 slices shown, 7 images]
[im 12/67  soft-tissue]
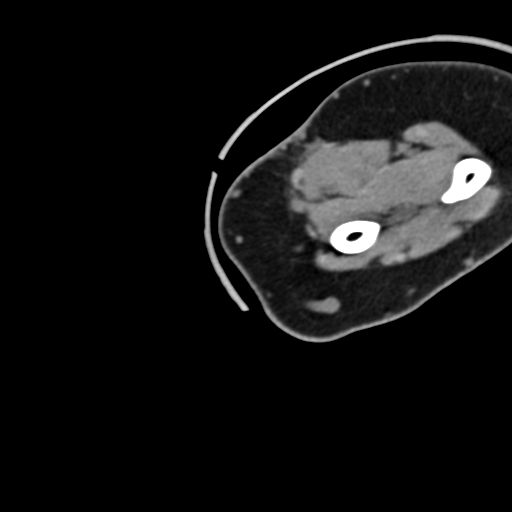
[im 12/67  bone]
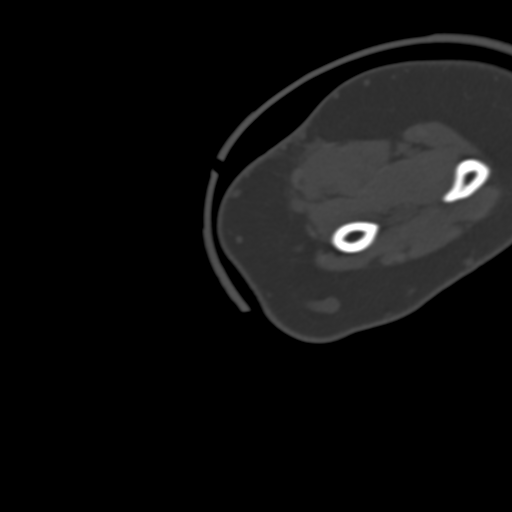
[im 23/67  bone]
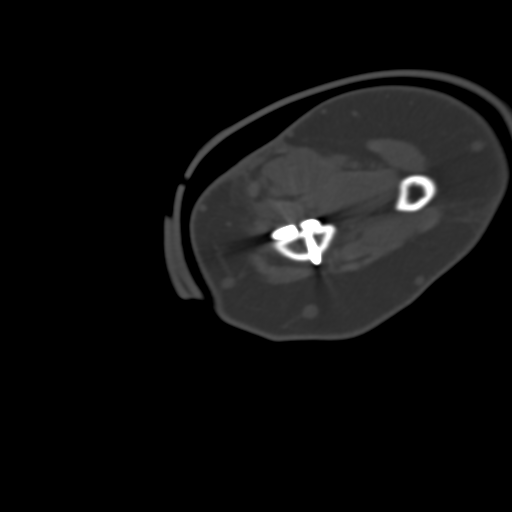
[im 34/67  bone]
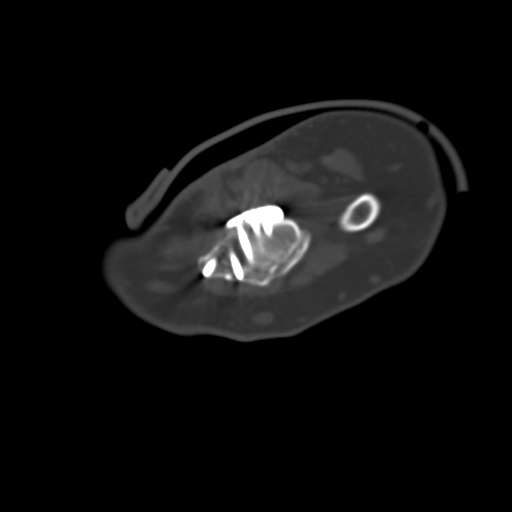
[im 45/67  bone]
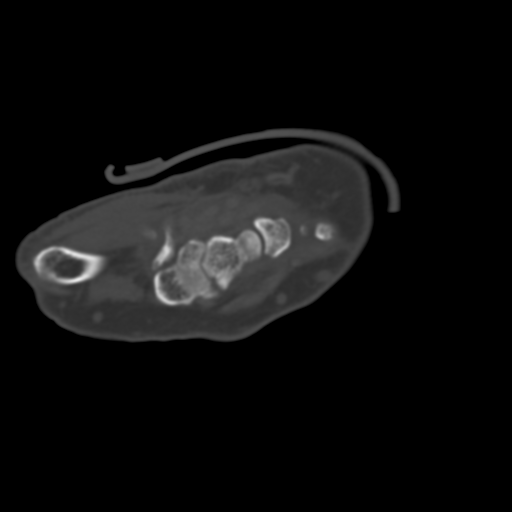
[im 56/67  soft-tissue]
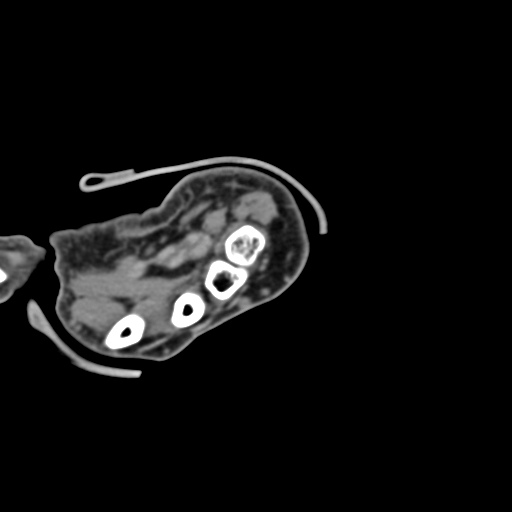
[im 56/67  bone]
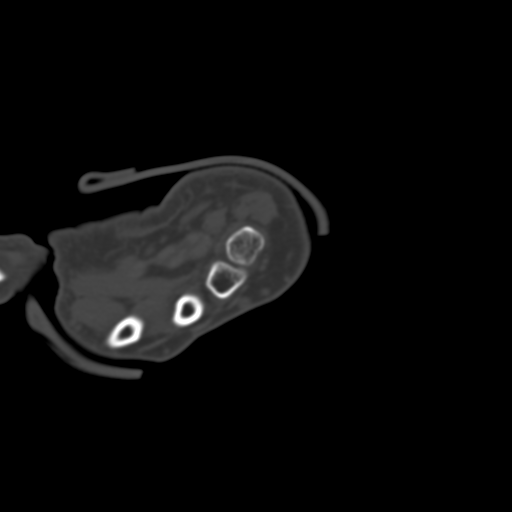

[Series 602: bone axial · axial · 0.33mm/px · z∈[-253,-216]mm · 3 of 68 slices shown]
[im 12/68  bone]
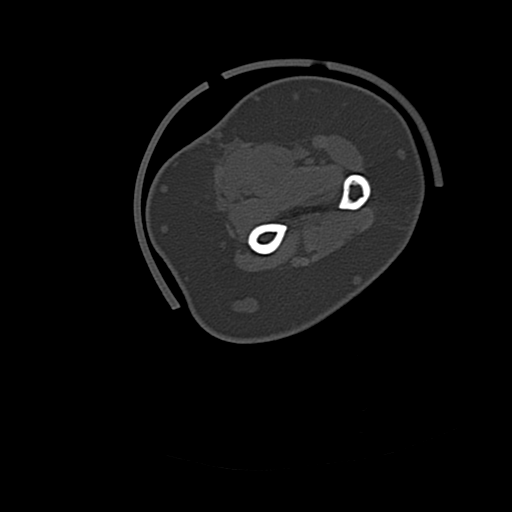
[im 23/68  bone]
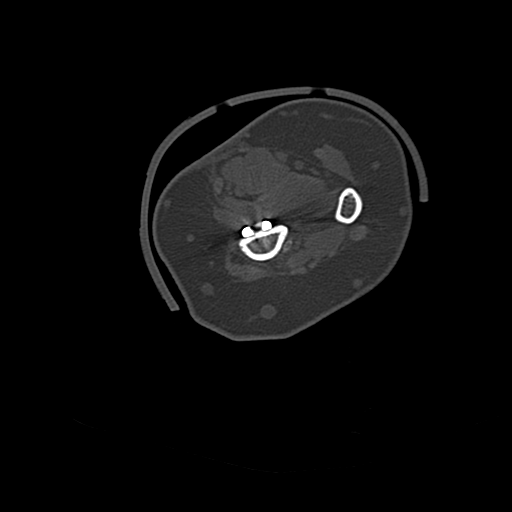
[im 34/68  bone]
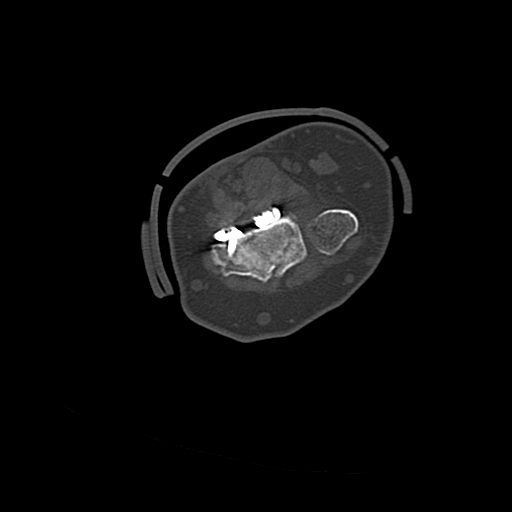

[Series 606: st cor · coronal · 0.33mm/px · 3 of 33 slices shown]
[im 7/33  bone]
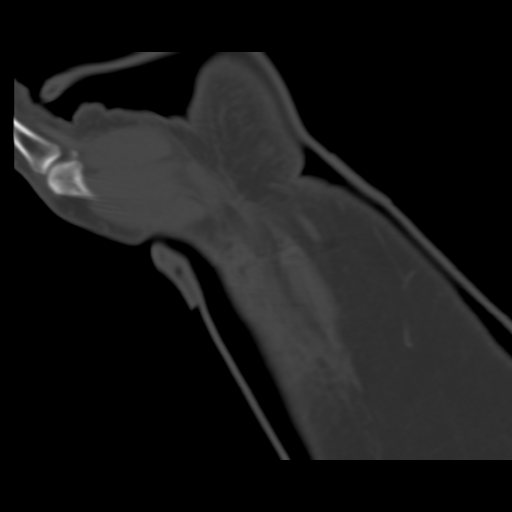
[im 13/33  bone]
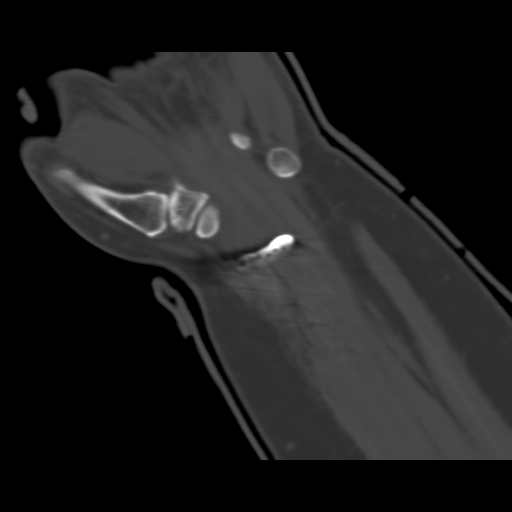
[im 20/33  bone]
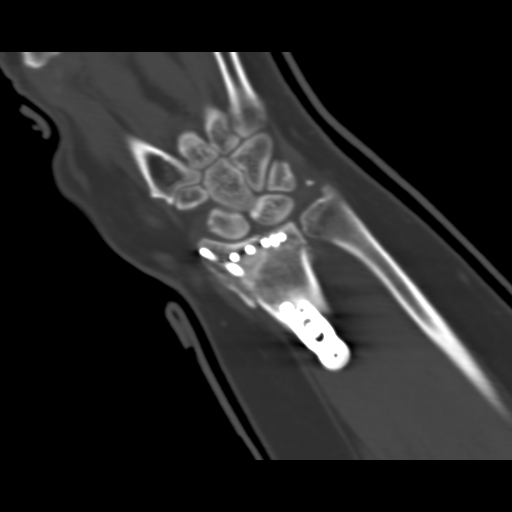

[Series 607: st sag · sagittal · 0.33mm/px · 5 of 57 slices shown, 6 images]
[im 19/57  bone]
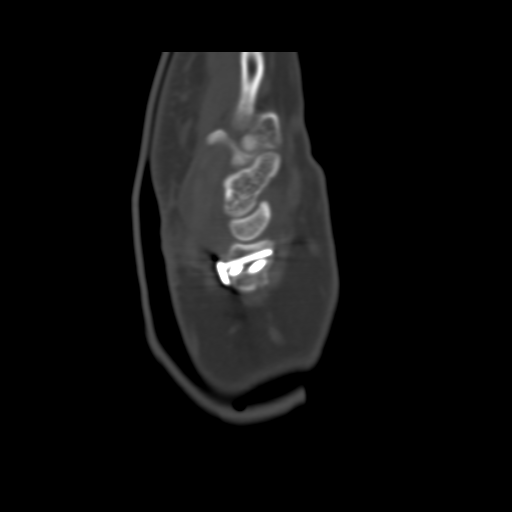
[im 24/57  bone]
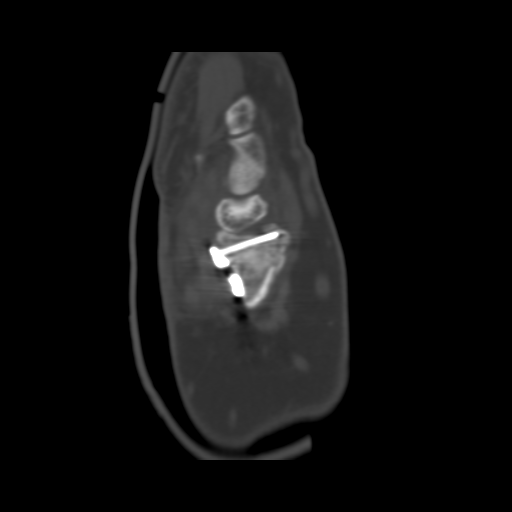
[im 29/57  soft-tissue]
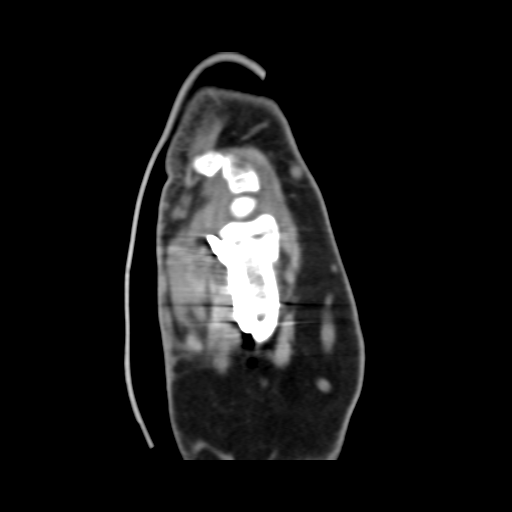
[im 29/57  bone]
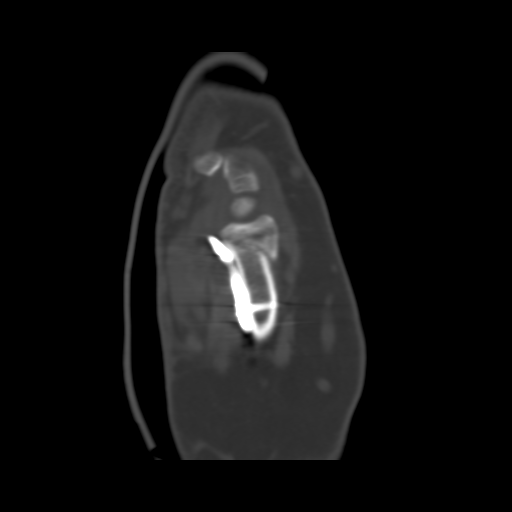
[im 33/57  bone]
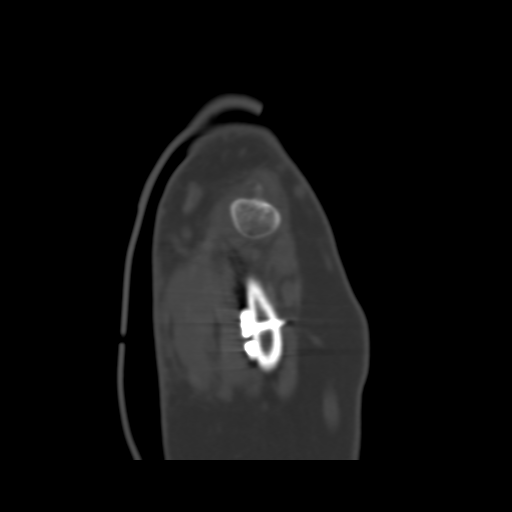
[im 38/57  bone]
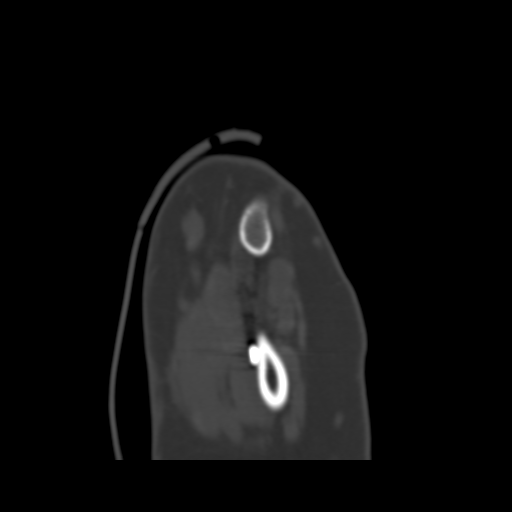

[16 of 33 positions shown; findings below may reference images not displayed]

FINDINGS: Volar plate and screws are seen fixing a fracture of the
distal radius.  Hardware is intact.  Position and alignment are
markedly improved.  There is comminution of the fracture along the
dorsal margin of the distal radius.  Although no screw enters the
joint space, the third screw from the ulnar is incompletely covered
by bone.  The fracture appears to be healing with blurring fracture
margins and callus formation identified.  Chip fracture off the
ulnar styloid is noted.  No other fracture is identified.
IMPRESSION: 1.  Status post ORIF of the distal radius fracture with marked
improvement in position and alignment.  Fracture appears to be
healing.  As noted above, no fracture is seen entering the
radiocarpal joint although the third screw from the ulnar side is
incompletely covered by bone.
2.  Chip fracture ulnar styloid.

## 2012-09-21 ENCOUNTER — Telehealth: Payer: Self-pay

## 2012-09-21 MED ORDER — PSEUDOEPH-CHLORPHEN-HYDROCOD 60-4-5 MG/5ML PO SOLN: 5.0000 mL | Freq: Three times a day (TID) | ORAL | Status: DC | PRN

## 2012-09-21 NOTE — Telephone Encounter (Signed)
Rx called in as directed. Pt aware.  

## 2012-09-21 NOTE — Telephone Encounter (Signed)
Call in.  If not improved, she needs eval here or with PCP.

## 2012-09-21 NOTE — Telephone Encounter (Signed)
Pt thinks beginning of cold or sinus infection; given Zutripro at [facility] in 2012 and that really helped.pt request refill to [facility].Please advise.

## 2012-09-23 ENCOUNTER — Encounter: Payer: Self-pay | Admitting: Family Medicine

## 2012-09-23 NOTE — Progress Notes (Signed)
Addendum to prev note.  No fevers but sinus pressure and congestion.  Some cough, occ.  D/w pt about using rx and f/u prn with PCP. She agreed.

## 2012-10-31 ENCOUNTER — Ambulatory Visit: Payer: Self-pay | Admitting: Family Medicine

## 2012-11-15 ENCOUNTER — Ambulatory Visit (INDEPENDENT_AMBULATORY_CARE_PROVIDER_SITE_OTHER): Payer: BC Managed Care – PPO | Admitting: Family Medicine

## 2012-11-15 ENCOUNTER — Encounter: Payer: Self-pay | Admitting: Family Medicine

## 2012-11-15 VITALS — BP 142/94 | HR 66 | Temp 98.3°F | Ht 66.0 in

## 2012-11-15 DIAGNOSIS — L309 Dermatitis, unspecified: Secondary | ICD-10-CM | POA: Insufficient documentation

## 2012-11-15 DIAGNOSIS — L259 Unspecified contact dermatitis, unspecified cause: Secondary | ICD-10-CM

## 2012-11-15 MED ORDER — MOMETASONE FUROATE 0.1 % EX CREA: TOPICAL_CREAM | Freq: Every day | CUTANEOUS | Status: DC

## 2012-11-15 NOTE — Progress Notes (Signed)
Subjective:    Patient ID: [name], female    DOB: 02/03/1956, 56 y.o.   MRN: [id number]  HPI Here with a rash on her R hand  Thinks it may have started with a burn on oven rack over a mo ago- got better -and now it looks red and inflammed again  Progressively getting bigger Stings a bit and itches a bit   Does not look like shingles Is scaly Never had eczema in the past   Has been washing some dishes   Tried some neosporin and anti fungal cream otc and last night tried some silvadene cream  No cortisone products   Patient Active Problem List  Diagnosis  . HYPERTENSION  . BREAST CYST  . ARTHRITIS, BACK  . Cellulitis and abscess  . Personal history of failed conscious sedation  . GERD (gastroesophageal reflux disease)  . Morbid obesity   Past Medical History  Diagnosis Date  . Allergy   . Anxiety   . GERD (gastroesophageal reflux disease)   . Hypertension   . Obesity    Past Surgical History  Procedure Date  . Cholecystectomy   . Abdominal hysterectomy   . Tubal ligation   . Upper gastrointestinal endoscopy 10/2005    hiatal hernia, reflux  . Colonoscopy multiple  . Wrist fracture surgery 04/14/11    left Dr. Merlyn Lot   History  Substance Use Topics  . Smoking status: Never Smoker   . Smokeless tobacco: Not on file  . Alcohol Use: No   Family History  Problem Relation Age of Onset  . Diabetes Mother   . Hypertension Mother   . Hyperlipidemia Mother   . Heart disease Mother     Angioplasty  . Arthritis Father   . Heart disease Father     CABG  . Hypertension Father   . Hyperlipidemia Father   . Asthma Daughter   . Cancer Paternal Aunt     Colon  . Rectal cancer Paternal Aunt   . Cancer Paternal Uncle     Lung, smoker   Allergies  Allergen Reactions  . Wheat Rash  . Codeine Itching   Current Outpatient Prescriptions on File Prior to Visit  Medication Sig Dispense Refill  . aspirin 81 MG tablet Take 81 mg by mouth daily.       Marland Kitchen atenolol  (TENORMIN) 100 MG tablet Take 100 mg by mouth daily.        Marland Kitchen b complex vitamins capsule Take 1 capsule by mouth daily.      . Cholecalciferol (VITAMIN D3) 5000 UNITS CAPS Take by mouth daily.        . citalopram (CELEXA) 20 MG tablet Take 20 mg by mouth daily.        . Flaxseed, Linseed, (FLAX SEED OIL) 1000 MG CAPS Take by mouth daily.        . furosemide (LASIX) 20 MG tablet Take 1 tablet by mouth. Twice a week      . gabapentin (NEURONTIN) 300 MG capsule Take 300 mg by mouth 3 (three) times daily as needed.        Marland Kitchen lisinopril-hydrochlorothiazide (PRINZIDE,ZESTORETIC) 10-12.5 MG per tablet Take 1 tablet by mouth daily.        . Magnesium 250 MG TABS Take 1 tablet by mouth daily.      Marland Kitchen VITAMIN A PO Take 1 capsule by mouth daily.      . vitamin C (ASCORBIC ACID) 500 MG tablet Take 500 mg by mouth  daily.          Review of SystemsReview of Systems  Constitutional: Negative for fever, appetite change, fatigue and unexpected weight change.  Eyes: Negative for pain and visual disturbance.  Respiratory: Negative for cough and shortness of breath.   Cardiovascular: Negative for cp or palpitations    Gastrointestinal: Negative for nausea, diarrhea and constipation.  Genitourinary: Negative for urgency and frequency.  Skin: Negative for pallor and pos for rash  Neurological: Negative for weakness, light-headedness, numbness and headaches.  Hematological: Negative for adenopathy. Does not bruise/bleed easily.  Psychiatric/Behavioral: Negative for dysphoric mood. The patient is not nervous/anxious.         Objective:   Physical Exam  Constitutional: She appears well-developed and well-nourished.  Neurological: She is alert.  Skin: Skin is warm and dry. Rash noted. There is erythema. No pallor.       Proximal right thumb area 2 by 1.5 cm oval area of erythema and scale- with some lines from band aid proximal to that  No vesicles/ drainage or excoriation noted   Psychiatric: She has a  normal mood and affect.          Assessment & Plan:

## 2012-11-15 NOTE — Patient Instructions (Addendum)
You have an irritated skin area that looks like eczema  Avoid harsh detergents/ hot water  Keep clean  Use the elocon cream as directed until clear- if not improving let me know If worse - also let me know

## 2012-11-15 NOTE — Assessment & Plan Note (Signed)
On proximal thumb area - redness and scale resembling eczema  Will tx with elocon cream and update  Disc avoidance of harsh chemicals and hot water  Update if not starting to improve in a week or if worsening

## 2012-11-19 ENCOUNTER — Ambulatory Visit: Payer: Self-pay | Admitting: Family Medicine

## 2013-02-01 ENCOUNTER — Encounter: Payer: Self-pay | Admitting: Family Medicine

## 2013-02-01 ENCOUNTER — Ambulatory Visit (INDEPENDENT_AMBULATORY_CARE_PROVIDER_SITE_OTHER): Payer: BC Managed Care – PPO | Admitting: Family Medicine

## 2013-02-01 VITALS — BP 140/80 | HR 63 | Temp 98.1°F

## 2013-02-01 DIAGNOSIS — R300 Dysuria: Secondary | ICD-10-CM

## 2013-02-01 DIAGNOSIS — N390 Urinary tract infection, site not specified: Secondary | ICD-10-CM

## 2013-02-01 LAB — POCT URINALYSIS DIPSTICK
Bilirubin, UA: NEGATIVE
Glucose, UA: NEGATIVE
Ketones, UA: NEGATIVE
Nitrite, UA: NEGATIVE
Protein, UA: NEGATIVE
Spec Grav, UA: 1.02
Urobilinogen, UA: NEGATIVE
pH, UA: 6

## 2013-02-01 MED ORDER — SULFAMETHOXAZOLE-TRIMETHOPRIM 800-160 MG PO TABS: 1.0000 | ORAL_TABLET | Freq: Two times a day (BID) | ORAL | Status: DC

## 2013-02-01 NOTE — Progress Notes (Signed)
Dysuria: yes duration of symptoms: since yesterday abdominal pain: yes, suprapubic Fevers:no fever but felt hot last night.   back pain: some yesterday, less today.  vomiting:no other concerns: this is similar to prev UTIs  Meds, vitals, and allergies reviewed.   ROS: See HPI.  Otherwise negative.    GEN: nad, alert and oriented NECK: supple CV: rrr.  PULM: ctab, no inc wob ABD: soft, +bs, suprapubic area tender EXT: no edema SKIN: no acute rash BACK: no CVA pain  ucx pending.

## 2013-02-01 NOTE — Patient Instructions (Addendum)
Drink plenty of water and start the antibiotics today.  We'll contact you with your lab report.  Take care.   

## 2013-02-01 NOTE — Assessment & Plan Note (Signed)
Nontoxic, start septra, check ucx.  F/u prn.

## 2013-02-04 LAB — URINE CULTURE: Colony Count: 60000

## 2013-02-28 ENCOUNTER — Ambulatory Visit (INDEPENDENT_AMBULATORY_CARE_PROVIDER_SITE_OTHER): Payer: BC Managed Care – PPO | Admitting: Family Medicine

## 2013-02-28 ENCOUNTER — Other Ambulatory Visit: Payer: Self-pay | Admitting: *Deleted

## 2013-02-28 VITALS — HR 75 | Temp 98.1°F

## 2013-02-28 DIAGNOSIS — R300 Dysuria: Secondary | ICD-10-CM

## 2013-02-28 DIAGNOSIS — N390 Urinary tract infection, site not specified: Secondary | ICD-10-CM

## 2013-02-28 LAB — POCT URINALYSIS DIPSTICK
Nitrite, UA: NEGATIVE
Spec Grav, UA: >=1.03
Urobilinogen, UA: NEGATIVE

## 2013-02-28 MED ORDER — CIPROFLOXACIN HCL 250 MG PO TABS: 250.0000 mg | ORAL_TABLET | Freq: Two times a day (BID) | ORAL | Status: DC

## 2013-02-28 NOTE — Assessment & Plan Note (Signed)
Treat with longer course of Cipro this time (last culture showed Ecoli pan-sensitive). Send for culture. Push fluids.

## 2013-02-28 NOTE — Progress Notes (Signed)
  Subjective:    Patient ID: [name], female    DOB: 1955/12/29, 57 y.o.   MRN: [id number]  HPI  57 year old female recently seen for UTI treated with 3 days of sulfa/tmp  on 3/15presents with recurrence of symptoms. Her symptoms resolved completely.  She reports that severe dysuria returned this AM. Bladder spasm, urinary urgency. Has been taking Azo, has helped.  No blood in urine.  Low back pain, no fever ( feeling hot and cold at times)  No N/V.   In the past it has taken a longer course of antibiotics to clear up her symtpoms.   Review of Systems  Constitutional: Negative for fever and fatigue.  HENT: Negative for ear pain.   Eyes: Negative for pain.  Respiratory: Negative for chest tightness and shortness of breath.   Cardiovascular: Negative for chest pain, palpitations and leg swelling.  Gastrointestinal: Positive for abdominal pain.  Genitourinary: Negative for dysuria.       Objective:   Physical Exam  Constitutional: Vital signs are normal. She appears well-developed and well-nourished. She is cooperative.  Non-toxic appearance. She does not appear ill. No distress.  HENT:  Head: Normocephalic.  Right Ear: Hearing, tympanic membrane, external ear and ear canal normal. Tympanic membrane is not erythematous, not retracted and not bulging.  Left Ear: Hearing, tympanic membrane, external ear and ear canal normal. Tympanic membrane is not erythematous, not retracted and not bulging.  Nose: No mucosal edema or rhinorrhea. Right sinus exhibits no maxillary sinus tenderness and no frontal sinus tenderness. Left sinus exhibits no maxillary sinus tenderness and no frontal sinus tenderness.  Mouth/Throat: Uvula is midline, oropharynx is clear and moist and mucous membranes are normal.  Eyes: Conjunctivae, EOM and lids are normal. Pupils are equal, round, and reactive to light. No foreign bodies found.  Neck: Trachea normal and normal range of motion. Neck supple. Carotid  bruit is not present. No mass and no thyromegaly present.  Cardiovascular: Normal rate, regular rhythm, S1 normal, S2 normal, normal heart sounds, intact distal pulses and normal pulses.  Exam reveals no gallop and no friction rub.   No murmur heard. Pulmonary/Chest: Effort normal and breath sounds normal. Not tachypneic. No respiratory distress. She has no decreased breath sounds. She has no wheezes. She has no rhonchi. She has no rales.  Abdominal: Soft. Normal appearance and bowel sounds are normal. There is tenderness in the suprapubic area. There is no CVA tenderness.  Mild low back ttp   Neurological: She is alert.  Skin: Skin is warm, dry and intact. No rash noted.  Psychiatric: Her speech is normal and behavior is normal. Judgment and thought content normal. Her mood appears not anxious. Cognition and memory are normal. She does not exhibit a depressed mood.          Assessment & Plan:

## 2013-03-03 LAB — URINE CULTURE

## 2013-03-04 ENCOUNTER — Telehealth: Payer: Self-pay | Admitting: Family Medicine

## 2013-03-04 NOTE — Telephone Encounter (Signed)
I was asked by pt to check her ucx results.  E coli sens to cipro, verbal report given to patient.

## 2013-05-21 ENCOUNTER — Ambulatory Visit: Payer: Self-pay | Admitting: Family Medicine

## 2013-06-25 ENCOUNTER — Ambulatory Visit: Payer: Self-pay | Admitting: Family Medicine

## 2013-06-26 ENCOUNTER — Ambulatory Visit: Payer: Self-pay | Admitting: Family Medicine

## 2014-05-31 ENCOUNTER — Other Ambulatory Visit: Payer: Self-pay | Admitting: Orthopedic Surgery

## 2014-07-23 ENCOUNTER — Encounter (HOSPITAL_COMMUNITY): Payer: Self-pay | Admitting: Pharmacy Technician

## 2014-07-25 ENCOUNTER — Encounter (HOSPITAL_COMMUNITY): Payer: Self-pay

## 2014-07-25 ENCOUNTER — Encounter (HOSPITAL_COMMUNITY)
Admission: RE | Admit: 2014-07-25 | Discharge: 2014-07-25 | Disposition: A | Discharge status: Home / Self Care | Payer: BC Managed Care – PPO | Source: Ambulatory Visit | Attending: Anesthesiology | Admitting: Anesthesiology

## 2014-07-25 ENCOUNTER — Encounter (HOSPITAL_COMMUNITY)
Admission: RE | Admit: 2014-07-25 | Discharge: 2014-07-25 | Disposition: A | Discharge status: Home / Self Care | Payer: BC Managed Care – PPO | Source: Ambulatory Visit | Attending: Orthopedic Surgery | Admitting: Orthopedic Surgery

## 2014-07-25 DIAGNOSIS — M1710 Unilateral primary osteoarthritis, unspecified knee: Secondary | ICD-10-CM | POA: Insufficient documentation

## 2014-07-25 DIAGNOSIS — Z01818 Encounter for other preprocedural examination: Secondary | ICD-10-CM | POA: Insufficient documentation

## 2014-07-25 HISTORY — DX: Sleep apnea, unspecified: G47.30

## 2014-07-25 HISTORY — DX: Palpitations: R00.2

## 2014-07-25 HISTORY — DX: Unspecified osteoarthritis, unspecified site: M19.90

## 2014-07-25 HISTORY — DX: Personal history of other diseases of the digestive system: Z87.19

## 2014-07-25 LAB — ABO/RH: ABO/RH(D): A POS

## 2014-07-25 LAB — CBC
HEMATOCRIT: 41.7 % (ref 36.0–46.0)
Hemoglobin: 14.4 g/dL (ref 12.0–15.0)
MCH: 31 pg (ref 26.0–34.0)
MCHC: 34.5 g/dL (ref 30.0–36.0)
MCV: 89.7 fL (ref 78.0–100.0)
Platelets: 268 10*3/uL (ref 150–400)
RBC: 4.65 MIL/uL (ref 3.87–5.11)
RDW: 13.3 % (ref 11.5–15.5)
WBC: 6.8 10*3/uL (ref 4.0–10.5)

## 2014-07-25 LAB — BASIC METABOLIC PANEL WITH GFR
Anion gap: 13 (ref 5–15)
BUN: 12 mg/dL (ref 6–23)
CO2: 26 meq/L (ref 19–32)
Calcium: 9.4 mg/dL (ref 8.4–10.5)
Chloride: 101 meq/L (ref 96–112)
Creatinine, Ser: 0.54 mg/dL (ref 0.50–1.10)
GFR calc Af Amer: >90 mL/min
GFR calc non Af Amer: >90 mL/min
Glucose, Bld: 103 mg/dL — ABNORMAL HIGH (ref 70–99)
Potassium: 4.2 meq/L (ref 3.7–5.3)
Sodium: 140 meq/L (ref 137–147)

## 2014-07-25 LAB — SURGICAL PCR SCREEN
MRSA, PCR: NEGATIVE
Staphylococcus aureus: POSITIVE — AB

## 2014-07-25 LAB — TYPE AND SCREEN
ABO/RH(D): A POS
Antibody Screen: NEGATIVE

## 2014-07-25 LAB — PROTIME-INR
INR: 1.07 (ref 0.00–1.49)
PROTHROMBIN TIME: 13.9 s (ref 11.6–15.2)

## 2014-07-25 LAB — APTT: aPTT: 28 seconds (ref 24–37)

## 2014-07-25 NOTE — Progress Notes (Signed)
Primary -dr Netty Starring [facility] clinic) No cardiologist Had ekg and echo about 5 years ago.- will request

## 2014-07-25 NOTE — Pre-Procedure Instructions (Signed)
ARNEISHA KINCANNON  07/25/2014   Your procedure is scheduled on:  Tuesday, September 4th  Report to [facility] Admitting at 830 AM.  Call this number if you have problems the morning of surgery: [phone]   Remember:   Do not eat food or drink liquids after midnight.   Take these medicines the morning of surgery with A SIP OF WATER: norvasc, atenolol, celexa, prilosec, flonase  Stop taking aspirin, OTC vitamins/herbal medications, NSAIDS (ibuprofen, advil, motrin) 7 days prior to surgery.    Do not wear jewelry, make-up or nail polish.  Do not wear lotions, powders, or perfumes. You may wear deodorant.  Do not shave 48 hours prior to surgery. Men may shave face and neck.  Do not bring valuables to the hospital.  [facility] is not responsible  for any belongings or valuables.               Contacts, dentures or bridgework may not be worn into surgery.  Leave suitcase in the car. After surgery it may be brought to your room.  For patients admitted to the hospital, discharge time is determined by your treatment team.               Patients discharged the day of surgery will not be allowed to drive home.  Please read over the following fact sheets that you were given: Pain Booklet, Coughing and Deep Breathing, Blood Transfusion Information, MRSA Information and Surgical Site Infection Prevention [place] - Preparing for Surgery  Before surgery, you can play an important role.  Because skin is not sterile, your skin needs to be as free of germs as possible.  You can reduce the number of germs on you skin by washing with CHG (chlorahexidine gluconate) soap before surgery.  CHG is an antiseptic cleaner which kills germs and bonds with the skin to continue killing germs even after washing.  Please DO NOT use if you have an allergy to CHG or antibacterial soaps.  If your skin becomes reddened/irritated stop using the CHG and inform your nurse when you arrive at Short Stay.  Do  not shave (including legs and underarms) for at least 48 hours prior to the first CHG shower.  You may shave your face.  Please follow these instructions carefully:   1.  Shower with CHG Soap the night before surgery and the morning of Surgery.  2.  If you choose to wash your hair, wash your hair first as usual with your normal shampoo.  3.  After you shampoo, rinse your hair and body thoroughly to remove the shampoo.  4.  Use CHG as you would any other liquid soap.  You can apply CHG directly to the skin and wash gently with scrungie or a clean washcloth.  5.  Apply the CHG Soap to your body ONLY FROM THE NECK DOWN.  Do not use on open wounds or open sores.  Avoid contact with your eyes, ears, mouth and genitals (private parts).  Wash genitals (private parts) with your normal soap.  6.  Wash thoroughly, paying special attention to the area where your surgery will be performed.  7.  Thoroughly rinse your body with warm water from the neck down.  8.  DO NOT shower/wash with your normal soap after using and rinsing off the CHG Soap.  9.  Pat yourself dry with a clean towel.            10.  Wear clean pajamas.  11.  Place clean sheets on your bed the night of your first shower and do not sleep with pets.  Day of Surgery  Do not apply any lotions/deoderants the morning of surgery.  Please wear clean clothes to the hospital/surgery center.

## 2014-07-25 NOTE — Progress Notes (Signed)
rx called in for mupirocin to [facility] [address]

## 2014-08-04 MED ORDER — DEXTROSE 5 % IV SOLN
3.0000 g | INTRAVENOUS | Status: AC
  Administered 2014-08-05: 3 g via INTRAVENOUS
  Filled 2014-08-04: qty 3000

## 2014-08-04 NOTE — Progress Notes (Signed)
Pt notified of time change;to arrive at 0600-verbalized understanding

## 2014-08-05 ENCOUNTER — Inpatient Hospital Stay (HOSPITAL_COMMUNITY): Payer: BC Managed Care – PPO

## 2014-08-05 ENCOUNTER — Encounter (HOSPITAL_COMMUNITY): Payer: Self-pay | Admitting: Anesthesiology

## 2014-08-05 ENCOUNTER — Inpatient Hospital Stay (HOSPITAL_COMMUNITY)
Admission: RE | Admit: 2014-08-05 | Discharge: 2014-08-06 | DRG: 470 | Disposition: A | Discharge status: Home Health | Payer: BC Managed Care – PPO | Source: Ambulatory Visit | Attending: Orthopedic Surgery | Admitting: Orthopedic Surgery

## 2014-08-05 ENCOUNTER — Inpatient Hospital Stay (HOSPITAL_COMMUNITY): Payer: BC Managed Care – PPO | Admitting: Anesthesiology

## 2014-08-05 ENCOUNTER — Encounter (HOSPITAL_COMMUNITY): Payer: BC Managed Care – PPO | Admitting: Anesthesiology

## 2014-08-05 ENCOUNTER — Encounter (HOSPITAL_COMMUNITY)
Admission: RE | Disposition: A | Discharge status: Home Health | Payer: Self-pay | Source: Ambulatory Visit | Attending: Orthopedic Surgery

## 2014-08-05 DIAGNOSIS — Z7901 Long term (current) use of anticoagulants: Secondary | ICD-10-CM | POA: Diagnosis not present

## 2014-08-05 DIAGNOSIS — K219 Gastro-esophageal reflux disease without esophagitis: Secondary | ICD-10-CM | POA: Diagnosis present

## 2014-08-05 DIAGNOSIS — Z7982 Long term (current) use of aspirin: Secondary | ICD-10-CM

## 2014-08-05 DIAGNOSIS — Z801 Family history of malignant neoplasm of trachea, bronchus and lung: Secondary | ICD-10-CM | POA: Diagnosis not present

## 2014-08-05 DIAGNOSIS — Z825 Family history of asthma and other chronic lower respiratory diseases: Secondary | ICD-10-CM | POA: Diagnosis not present

## 2014-08-05 DIAGNOSIS — G4730 Sleep apnea, unspecified: Secondary | ICD-10-CM | POA: Diagnosis present

## 2014-08-05 DIAGNOSIS — Z8249 Family history of ischemic heart disease and other diseases of the circulatory system: Secondary | ICD-10-CM | POA: Diagnosis not present

## 2014-08-05 DIAGNOSIS — Z833 Family history of diabetes mellitus: Secondary | ICD-10-CM

## 2014-08-05 DIAGNOSIS — Z79899 Other long term (current) drug therapy: Secondary | ICD-10-CM | POA: Diagnosis not present

## 2014-08-05 DIAGNOSIS — Z6841 Body Mass Index (BMI) 40.0 and over, adult: Secondary | ICD-10-CM

## 2014-08-05 DIAGNOSIS — F411 Generalized anxiety disorder: Secondary | ICD-10-CM | POA: Diagnosis present

## 2014-08-05 DIAGNOSIS — Z800 Family history of malignant neoplasm of digestive organs: Secondary | ICD-10-CM

## 2014-08-05 DIAGNOSIS — Z888 Allergy status to other drugs, medicaments and biological substances status: Secondary | ICD-10-CM | POA: Diagnosis not present

## 2014-08-05 DIAGNOSIS — M1710 Unilateral primary osteoarthritis, unspecified knee: Principal | ICD-10-CM | POA: Diagnosis present

## 2014-08-05 DIAGNOSIS — Z8261 Family history of arthritis: Secondary | ICD-10-CM

## 2014-08-05 DIAGNOSIS — Z9851 Tubal ligation status: Secondary | ICD-10-CM | POA: Diagnosis not present

## 2014-08-05 DIAGNOSIS — M179 Osteoarthritis of knee, unspecified: Secondary | ICD-10-CM | POA: Diagnosis present

## 2014-08-05 DIAGNOSIS — I10 Essential (primary) hypertension: Secondary | ICD-10-CM | POA: Diagnosis present

## 2014-08-05 DIAGNOSIS — M1711 Unilateral primary osteoarthritis, right knee: Secondary | ICD-10-CM

## 2014-08-05 HISTORY — DX: Unilateral primary osteoarthritis, right knee: M17.11

## 2014-08-05 HISTORY — DX: Family history of other specified conditions: Z84.89

## 2014-08-05 HISTORY — PX: PARTIAL KNEE ARTHROPLASTY: SHX2174

## 2014-08-05 SURGERY — ARTHROPLASTY, KNEE, UNICOMPARTMENTAL
Anesthesia: General | Site: Knee | Laterality: Right

## 2014-08-05 MED ORDER — ASPIRIN EC 81 MG PO TBEC
81.0000 mg | DELAYED_RELEASE_TABLET | Freq: Every day | ORAL | Status: DC
  Administered 2014-08-05 – 2014-08-06 (×2): 81 mg via ORAL
  Filled 2014-08-05 (×2): qty 1

## 2014-08-05 MED ORDER — DIPHENHYDRAMINE HCL 50 MG/ML IJ SOLN
INTRAMUSCULAR | Status: AC
  Filled 2014-08-05: qty 1

## 2014-08-05 MED ORDER — OXYCODONE HCL 5 MG PO TABS
5.0000 mg | ORAL_TABLET | ORAL | Status: DC | PRN
  Administered 2014-08-05 – 2014-08-06 (×3): 10 mg via ORAL
  Administered 2014-08-06: 5 mg via ORAL
  Filled 2014-08-05 (×3): qty 2
  Filled 2014-08-05: qty 1

## 2014-08-05 MED ORDER — BISACODYL 10 MG RE SUPP: 10.0000 mg | Freq: Every day | RECTAL | Status: DC | PRN

## 2014-08-05 MED ORDER — B COMPLEX VITAMINS PO CAPS: 1.0000 | ORAL_CAPSULE | Freq: Every day | ORAL | Status: DC

## 2014-08-05 MED ORDER — DIPHENHYDRAMINE HCL 50 MG/ML IJ SOLN
INTRAMUSCULAR | Status: DC | PRN
  Administered 2014-08-05: 12.5 mg via INTRAVENOUS

## 2014-08-05 MED ORDER — LIDOCAINE HCL (CARDIAC) 20 MG/ML IV SOLN
INTRAVENOUS | Status: AC
  Filled 2014-08-05: qty 5

## 2014-08-05 MED ORDER — BACLOFEN 10 MG PO TABS: 10.0000 mg | ORAL_TABLET | Freq: Three times a day (TID) | ORAL | Status: DC

## 2014-08-05 MED ORDER — FLUTICASONE PROPIONATE 50 MCG/ACT NA SUSP: 1.0000 | Freq: Every day | NASAL | Status: DC | PRN

## 2014-08-05 MED ORDER — LISINOPRIL-HYDROCHLOROTHIAZIDE 20-12.5 MG PO TABS: 2.0000 | ORAL_TABLET | Freq: Every day | ORAL | Status: DC

## 2014-08-05 MED ORDER — HYDROMORPHONE HCL PF 1 MG/ML IJ SOLN
0.2500 mg | INTRAMUSCULAR | Status: DC | PRN
  Administered 2014-08-05 (×2): 0.25 mg via INTRAVENOUS
  Administered 2014-08-05: 0.5 mg via INTRAVENOUS

## 2014-08-05 MED ORDER — FENTANYL CITRATE 0.05 MG/ML IJ SOLN
INTRAMUSCULAR | Status: DC | PRN
  Administered 2014-08-05 (×3): 50 ug via INTRAVENOUS
  Administered 2014-08-05: 100 ug via INTRAVENOUS
  Administered 2014-08-05 (×5): 50 ug via INTRAVENOUS

## 2014-08-05 MED ORDER — ONDANSETRON HCL 4 MG PO TABS: 4.0000 mg | ORAL_TABLET | Freq: Three times a day (TID) | ORAL | Status: DC | PRN

## 2014-08-05 MED ORDER — SENNA 8.6 MG PO TABS
1.0000 | ORAL_TABLET | Freq: Two times a day (BID) | ORAL | Status: DC
  Administered 2014-08-05 – 2014-08-06 (×3): 8.6 mg via ORAL
  Filled 2014-08-05 (×4): qty 1

## 2014-08-05 MED ORDER — PROMETHAZINE HCL 25 MG/ML IJ SOLN: 6.2500 mg | INTRAMUSCULAR | Status: DC | PRN

## 2014-08-05 MED ORDER — FUROSEMIDE 20 MG PO TABS
20.0000 mg | ORAL_TABLET | Freq: Every day | ORAL | Status: DC
  Administered 2014-08-05 – 2014-08-06 (×2): 20 mg via ORAL
  Filled 2014-08-05 (×2): qty 1

## 2014-08-05 MED ORDER — DEXAMETHASONE 6 MG PO TABS
10.0000 mg | ORAL_TABLET | Freq: Three times a day (TID) | ORAL | Status: AC
  Administered 2014-08-05 – 2014-08-06 (×3): 10 mg via ORAL
  Filled 2014-08-05 (×3): qty 1

## 2014-08-05 MED ORDER — MAGNESIUM OXIDE 400 (241.3 MG) MG PO TABS
400.0000 mg | ORAL_TABLET | Freq: Every day | ORAL | Status: DC
  Administered 2014-08-06: 400 mg via ORAL
  Filled 2014-08-05 (×3): qty 1

## 2014-08-05 MED ORDER — DEXAMETHASONE SODIUM PHOSPHATE 10 MG/ML IJ SOLN
10.0000 mg | Freq: Three times a day (TID) | INTRAMUSCULAR | Status: AC
  Filled 2014-08-05 (×3): qty 1

## 2014-08-05 MED ORDER — LISINOPRIL 20 MG PO TABS
20.0000 mg | ORAL_TABLET | Freq: Every day | ORAL | Status: DC
  Administered 2014-08-05 – 2014-08-06 (×2): 20 mg via ORAL
  Filled 2014-08-05 (×2): qty 1

## 2014-08-05 MED ORDER — MIDAZOLAM HCL 5 MG/5ML IJ SOLN
INTRAMUSCULAR | Status: DC | PRN
  Administered 2014-08-05: 2 mg via INTRAVENOUS

## 2014-08-05 MED ORDER — METHOCARBAMOL 500 MG PO TABS
500.0000 mg | ORAL_TABLET | Freq: Four times a day (QID) | ORAL | Status: DC | PRN
  Administered 2014-08-05 – 2014-08-06 (×3): 500 mg via ORAL
  Filled 2014-08-05 (×2): qty 1

## 2014-08-05 MED ORDER — ATENOLOL 100 MG PO TABS
100.0000 mg | ORAL_TABLET | Freq: Every day | ORAL | Status: DC
  Administered 2014-08-06: 100 mg via ORAL
  Filled 2014-08-05: qty 1

## 2014-08-05 MED ORDER — ONDANSETRON HCL 4 MG/2ML IJ SOLN
INTRAMUSCULAR | Status: DC | PRN
  Administered 2014-08-05: 4 mg via INTRAVENOUS

## 2014-08-05 MED ORDER — GLYCOPYRROLATE 0.2 MG/ML IJ SOLN
INTRAMUSCULAR | Status: DC | PRN
  Administered 2014-08-05: 0.4 mg via INTRAVENOUS

## 2014-08-05 MED ORDER — PANTOPRAZOLE SODIUM 40 MG PO TBEC
80.0000 mg | DELAYED_RELEASE_TABLET | Freq: Every day | ORAL | Status: DC
  Administered 2014-08-05 – 2014-08-06 (×2): 80 mg via ORAL
  Filled 2014-08-05 (×2): qty 2

## 2014-08-05 MED ORDER — METHOCARBAMOL 1000 MG/10ML IJ SOLN: 500.0000 mg | Freq: Four times a day (QID) | INTRAMUSCULAR | Status: DC | PRN

## 2014-08-05 MED ORDER — FENTANYL CITRATE 0.05 MG/ML IJ SOLN
INTRAMUSCULAR | Status: AC
  Filled 2014-08-05: qty 5

## 2014-08-05 MED ORDER — HYDROCHLOROTHIAZIDE 12.5 MG PO CAPS
12.5000 mg | ORAL_CAPSULE | Freq: Every day | ORAL | Status: DC
  Administered 2014-08-05 – 2014-08-06 (×2): 12.5 mg via ORAL
  Filled 2014-08-05 (×2): qty 1

## 2014-08-05 MED ORDER — RIVAROXABAN 10 MG PO TABS: 10.0000 mg | ORAL_TABLET | Freq: Every day | ORAL | Status: DC

## 2014-08-05 MED ORDER — GLYCOPYRROLATE 0.2 MG/ML IJ SOLN
INTRAMUSCULAR | Status: AC
  Filled 2014-08-05: qty 4

## 2014-08-05 MED ORDER — ROCURONIUM BROMIDE 100 MG/10ML IV SOLN
INTRAVENOUS | Status: DC | PRN
  Administered 2014-08-05: 50 mg via INTRAVENOUS

## 2014-08-05 MED ORDER — METHOCARBAMOL 500 MG PO TABS
ORAL_TABLET | ORAL | Status: AC
  Administered 2014-08-05: 500 mg via ORAL
  Filled 2014-08-05: qty 1

## 2014-08-05 MED ORDER — ACETAMINOPHEN 325 MG PO TABS: 650.0000 mg | ORAL_TABLET | Freq: Four times a day (QID) | ORAL | Status: DC | PRN

## 2014-08-05 MED ORDER — AMLODIPINE BESYLATE 5 MG PO TABS
5.0000 mg | ORAL_TABLET | Freq: Every day | ORAL | Status: DC
  Administered 2014-08-05 – 2014-08-06 (×2): 5 mg via ORAL
  Filled 2014-08-05 (×2): qty 1

## 2014-08-05 MED ORDER — PROPOFOL 10 MG/ML IV BOLUS
INTRAVENOUS | Status: AC
  Filled 2014-08-05: qty 20

## 2014-08-05 MED ORDER — GABAPENTIN 300 MG PO CAPS
300.0000 mg | ORAL_CAPSULE | Freq: Every day | ORAL | Status: DC
  Administered 2014-08-05: 300 mg via ORAL
  Filled 2014-08-05 (×2): qty 1

## 2014-08-05 MED ORDER — PROPOFOL 10 MG/ML IV BOLUS
INTRAVENOUS | Status: DC | PRN
  Administered 2014-08-05: 200 mg via INTRAVENOUS

## 2014-08-05 MED ORDER — DOCUSATE SODIUM 100 MG PO CAPS
100.0000 mg | ORAL_CAPSULE | Freq: Two times a day (BID) | ORAL | Status: DC
  Administered 2014-08-05 – 2014-08-06 (×2): 100 mg via ORAL
  Filled 2014-08-05 (×2): qty 1

## 2014-08-05 MED ORDER — KETOROLAC TROMETHAMINE 15 MG/ML IJ SOLN
7.5000 mg | Freq: Four times a day (QID) | INTRAMUSCULAR | Status: AC
  Administered 2014-08-05 – 2014-08-06 (×4): 7.5 mg via INTRAVENOUS
  Filled 2014-08-05: qty 1

## 2014-08-05 MED ORDER — SENNA-DOCUSATE SODIUM 8.6-50 MG PO TABS: 2.0000 | ORAL_TABLET | Freq: Every day | ORAL | Status: DC

## 2014-08-05 MED ORDER — ALUM & MAG HYDROXIDE-SIMETH 200-200-20 MG/5ML PO SUSP: 30.0000 mL | ORAL | Status: DC | PRN

## 2014-08-05 MED ORDER — B COMPLEX-C PO TABS
1.0000 | ORAL_TABLET | Freq: Every day | ORAL | Status: DC
  Administered 2014-08-06: 1 via ORAL
  Filled 2014-08-05: qty 1

## 2014-08-05 MED ORDER — HYDROMORPHONE HCL PF 1 MG/ML IJ SOLN
INTRAMUSCULAR | Status: AC
  Administered 2014-08-05: 0.5 mg via INTRAVENOUS
  Filled 2014-08-05: qty 1

## 2014-08-05 MED ORDER — MAGNESIUM CITRATE PO SOLN: 1.0000 | Freq: Once | ORAL | Status: AC | PRN

## 2014-08-05 MED ORDER — CEFAZOLIN SODIUM-DEXTROSE 2-3 GM-% IV SOLR
2.0000 g | Freq: Four times a day (QID) | INTRAVENOUS | Status: AC
  Administered 2014-08-05 (×2): 2 g via INTRAVENOUS
  Filled 2014-08-05 (×2): qty 50

## 2014-08-05 MED ORDER — POTASSIUM CHLORIDE IN NACL 20-0.45 MEQ/L-% IV SOLN
INTRAVENOUS | Status: DC
  Administered 2014-08-05 – 2014-08-06 (×2): via INTRAVENOUS
  Filled 2014-08-05 (×4): qty 1000

## 2014-08-05 MED ORDER — ROCURONIUM BROMIDE 50 MG/5ML IV SOLN
INTRAVENOUS | Status: AC
  Filled 2014-08-05: qty 1

## 2014-08-05 MED ORDER — LACTATED RINGERS IV SOLN
INTRAVENOUS | Status: DC | PRN
  Administered 2014-08-05 (×2): via INTRAVENOUS

## 2014-08-05 MED ORDER — RIVAROXABAN 10 MG PO TABS
10.0000 mg | ORAL_TABLET | Freq: Every day | ORAL | Status: DC
  Administered 2014-08-06: 10 mg via ORAL
  Filled 2014-08-05 (×2): qty 1

## 2014-08-05 MED ORDER — SCOPOLAMINE 1 MG/3DAYS TD PT72
MEDICATED_PATCH | TRANSDERMAL | Status: AC
  Administered 2014-08-05: 1 via TRANSDERMAL
  Filled 2014-08-05: qty 1

## 2014-08-05 MED ORDER — SODIUM CHLORIDE 0.9 % IR SOLN
Status: DC | PRN
  Administered 2014-08-05: 1000 mL

## 2014-08-05 MED ORDER — DEXAMETHASONE SODIUM PHOSPHATE 10 MG/ML IJ SOLN
INTRAMUSCULAR | Status: DC | PRN
  Administered 2014-08-05: 4 mg via INTRAVENOUS

## 2014-08-05 MED ORDER — ONDANSETRON HCL 4 MG/2ML IJ SOLN: 4.0000 mg | Freq: Four times a day (QID) | INTRAMUSCULAR | Status: DC | PRN

## 2014-08-05 MED ORDER — BUPIVACAINE-EPINEPHRINE (PF) 0.5% -1:200000 IJ SOLN
INTRAMUSCULAR | Status: DC | PRN
  Administered 2014-08-05: 30 mL via PERINEURAL

## 2014-08-05 MED ORDER — KETOROLAC TROMETHAMINE 15 MG/ML IJ SOLN
INTRAMUSCULAR | Status: AC
  Filled 2014-08-05: qty 1

## 2014-08-05 MED ORDER — CITALOPRAM HYDROBROMIDE 20 MG PO TABS
20.0000 mg | ORAL_TABLET | Freq: Every day | ORAL | Status: DC
  Administered 2014-08-05 – 2014-08-06 (×2): 20 mg via ORAL
  Filled 2014-08-05 (×2): qty 1

## 2014-08-05 MED ORDER — POLYETHYLENE GLYCOL 3350 17 G PO PACK: 17.0000 g | PACK | Freq: Every day | ORAL | Status: DC | PRN

## 2014-08-05 MED ORDER — MIDAZOLAM HCL 2 MG/2ML IJ SOLN
INTRAMUSCULAR | Status: AC
  Filled 2014-08-05: qty 2

## 2014-08-05 MED ORDER — ACETAMINOPHEN 650 MG RE SUPP: 650.0000 mg | Freq: Four times a day (QID) | RECTAL | Status: DC | PRN

## 2014-08-05 MED ORDER — ONDANSETRON HCL 4 MG/2ML IJ SOLN
INTRAMUSCULAR | Status: AC
  Filled 2014-08-05: qty 2

## 2014-08-05 MED ORDER — VITAMIN C 500 MG PO TABS
500.0000 mg | ORAL_TABLET | Freq: Every day | ORAL | Status: DC
  Administered 2014-08-05 – 2014-08-06 (×2): 500 mg via ORAL
  Filled 2014-08-05 (×2): qty 1

## 2014-08-05 MED ORDER — NEOSTIGMINE METHYLSULFATE 10 MG/10ML IV SOLN
INTRAVENOUS | Status: AC
  Filled 2014-08-05: qty 1

## 2014-08-05 MED ORDER — ONDANSETRON HCL 4 MG PO TABS: 4.0000 mg | ORAL_TABLET | Freq: Four times a day (QID) | ORAL | Status: DC | PRN

## 2014-08-05 MED ORDER — PHENOL 1.4 % MT LIQD: 1.0000 | OROMUCOSAL | Status: DC | PRN

## 2014-08-05 MED ORDER — DEXAMETHASONE SODIUM PHOSPHATE 10 MG/ML IJ SOLN
INTRAMUSCULAR | Status: AC
  Filled 2014-08-05: qty 1

## 2014-08-05 MED ORDER — MENTHOL 3 MG MT LOZG: 1.0000 | LOZENGE | OROMUCOSAL | Status: DC | PRN

## 2014-08-05 MED ORDER — METOCLOPRAMIDE HCL 5 MG/ML IJ SOLN: 5.0000 mg | Freq: Three times a day (TID) | INTRAMUSCULAR | Status: DC | PRN

## 2014-08-05 MED ORDER — OXYCODONE-ACETAMINOPHEN 10-325 MG PO TABS: 1.0000 | ORAL_TABLET | Freq: Four times a day (QID) | ORAL | Status: DC | PRN

## 2014-08-05 MED ORDER — DEXAMETHASONE SODIUM PHOSPHATE 4 MG/ML IJ SOLN
INTRAMUSCULAR | Status: AC
  Filled 2014-08-05: qty 1

## 2014-08-05 MED ORDER — METOCLOPRAMIDE HCL 10 MG PO TABS: 5.0000 mg | ORAL_TABLET | Freq: Three times a day (TID) | ORAL | Status: DC | PRN

## 2014-08-05 MED ORDER — NEOSTIGMINE METHYLSULFATE 10 MG/10ML IV SOLN
INTRAVENOUS | Status: DC | PRN
  Administered 2014-08-05: 2 mg via INTRAVENOUS

## 2014-08-05 MED ORDER — DIPHENHYDRAMINE HCL 12.5 MG/5ML PO ELIX: 12.5000 mg | ORAL_SOLUTION | ORAL | Status: DC | PRN

## 2014-08-05 MED ORDER — HYDROMORPHONE HCL PF 1 MG/ML IJ SOLN: 1.0000 mg | INTRAMUSCULAR | Status: DC | PRN

## 2014-08-05 SURGICAL SUPPLY — 68 items
APL SKNCLS STERI-STRIP NONHPOA (GAUZE/BANDAGES/DRESSINGS) ×1
BANDAGE ELASTIC 6 VELCRO ST LF (GAUZE/BANDAGES/DRESSINGS) ×3 IMPLANT
BANDAGE ESMARK 6X9 LF (GAUZE/BANDAGES/DRESSINGS) ×1 IMPLANT
BENZOIN TINCTURE PRP APPL 2/3 (GAUZE/BANDAGES/DRESSINGS) ×3 IMPLANT
BIT DRILL QUICK REL 1/8 2PK SL (DRILL) ×1 IMPLANT
BLADE SAW OSCILLATING 7SYS (BLADE) ×3 IMPLANT
BNDG CMPR 9X6 STRL LF SNTH (GAUZE/BANDAGES/DRESSINGS) ×1
BNDG ESMARK 6X9 LF (GAUZE/BANDAGES/DRESSINGS) ×3
BOWL SMART MIX CTS (DISPOSABLE) ×3 IMPLANT
CAPT KNEE OXFORD ×3 IMPLANT
CEMENT HV SMART SET (Cement) ×3 IMPLANT
CLOSURE STERI-STRIP 1/2X4 (GAUZE/BANDAGES/DRESSINGS) ×1
CLOSURE WOUND 1/2 X4 (GAUZE/BANDAGES/DRESSINGS) ×1
CLSR STERI-STRIP ANTIMIC 1/2X4 (GAUZE/BANDAGES/DRESSINGS) ×2 IMPLANT
COVER SURGICAL LIGHT HANDLE (MISCELLANEOUS) ×3 IMPLANT
CUFF TOURNIQUET SINGLE 34IN LL (TOURNIQUET CUFF) IMPLANT
CUFF TOURNIQUET SINGLE 44IN (TOURNIQUET CUFF) ×3 IMPLANT
DRAPE EXTREMITY T 121X128X90 (DRAPE) ×3 IMPLANT
DRAPE ORTHO SPLIT 77X108 STRL (DRAPES) ×3
DRAPE PROXIMA HALF (DRAPES) ×3 IMPLANT
DRAPE SURG ORHT 6 SPLT 77X108 (DRAPES) ×1 IMPLANT
DRAPE U-SHAPE 47X51 STRL (DRAPES) ×3 IMPLANT
DRILL QUICK RELEASE 1/8 INCH (DRILL) ×2
DURAPREP 26ML APPLICATOR (WOUND CARE) ×3 IMPLANT
ELECT CAUTERY BLADE 6.4 (BLADE) ×3 IMPLANT
ELECT REM PT RETURN 9FT ADLT (ELECTROSURGICAL) ×3
ELECTRODE REM PT RTRN 9FT ADLT (ELECTROSURGICAL) ×1 IMPLANT
GAUZE SPONGE 4X4 12PLY STRL (GAUZE/BANDAGES/DRESSINGS) ×3 IMPLANT
GLOVE BIOGEL PI IND STRL 7.5 (GLOVE) ×1 IMPLANT
GLOVE BIOGEL PI INDICATOR 7.5 (GLOVE) ×2
GLOVE BIOGEL PI ORTHO PRO SZ8 (GLOVE) ×4
GLOVE ORTHO TXT STRL SZ7.5 (GLOVE) ×3 IMPLANT
GLOVE PI ORTHO PRO STRL SZ8 (GLOVE) ×2 IMPLANT
GLOVE SURG ORTHO 8.0 STRL STRW (GLOVE) ×6 IMPLANT
GOWN STRL REUS W/ TWL LRG LVL3 (GOWN DISPOSABLE) ×2 IMPLANT
GOWN STRL REUS W/ TWL XL LVL3 (GOWN DISPOSABLE) ×1 IMPLANT
GOWN STRL REUS W/TWL 2XL LVL3 (GOWN DISPOSABLE) IMPLANT
GOWN STRL REUS W/TWL LRG LVL3 (GOWN DISPOSABLE) ×6
GOWN STRL REUS W/TWL XL LVL3 (GOWN DISPOSABLE) ×3
HANDPIECE INTERPULSE COAX TIP (DISPOSABLE) ×3
HOOD PEEL AWAY FACE SHEILD DIS (HOOD) ×6 IMPLANT
IMMOBILIZER KNEE 22 UNIV (SOFTGOODS) ×3 IMPLANT
KIT BASIN OR (CUSTOM PROCEDURE TRAY) ×3 IMPLANT
KIT ROOM TURNOVER OR (KITS) ×3 IMPLANT
MANIFOLD NEPTUNE II (INSTRUMENTS) ×3 IMPLANT
NEEDLE HYPO 21X1.5 SAFETY (NEEDLE) IMPLANT
NS IRRIG 1000ML POUR BTL (IV SOLUTION) ×3 IMPLANT
PACK TOTAL JOINT (CUSTOM PROCEDURE TRAY) ×3 IMPLANT
PAD ABD 8X10 STRL (GAUZE/BANDAGES/DRESSINGS) ×3 IMPLANT
PAD ARMBOARD 7.5X6 YLW CONV (MISCELLANEOUS) ×6 IMPLANT
PAD CAST 4YDX4 CTTN HI CHSV (CAST SUPPLIES) ×1 IMPLANT
PADDING CAST COTTON 4X4 STRL (CAST SUPPLIES) ×3
PADDING CAST COTTON 6X4 STRL (CAST SUPPLIES) ×3 IMPLANT
SAWBLADE OXFORD PARTIAL (BLADE) ×3 IMPLANT
SET HNDPC FAN SPRY TIP SCT (DISPOSABLE) ×1 IMPLANT
SPONGE GAUZE 4X4 12PLY STER LF (GAUZE/BANDAGES/DRESSINGS) ×3 IMPLANT
STRIP CLOSURE SKIN 1/2X4 (GAUZE/BANDAGES/DRESSINGS) ×2 IMPLANT
SUCTION FRAZIER TIP 10 FR DISP (SUCTIONS) ×3 IMPLANT
SUT MNCRL AB 4-0 PS2 18 (SUTURE) IMPLANT
SUT VIC AB 0 CT1 27 (SUTURE) ×3
SUT VIC AB 0 CT1 27XBRD ANBCTR (SUTURE) ×1 IMPLANT
SUT VIC AB 1 CT1 27 (SUTURE) ×3
SUT VIC AB 1 CT1 27XBRD ANBCTR (SUTURE) ×1 IMPLANT
SUT VIC AB 3-0 SH 8-18 (SUTURE) ×3 IMPLANT
SYR CONTROL 10ML LL (SYRINGE) IMPLANT
TOWEL OR 17X24 6PK STRL BLUE (TOWEL DISPOSABLE) ×3 IMPLANT
TOWEL OR 17X26 10 PK STRL BLUE (TOWEL DISPOSABLE) ×3 IMPLANT
WATER STERILE IRR 1000ML POUR (IV SOLUTION) ×3 IMPLANT

## 2014-08-05 NOTE — Anesthesia Preprocedure Evaluation (Addendum)
Anesthesia Evaluation  Patient identified by MRN, date of birth, ID band Patient awake    Reviewed: Allergy & Precautions, H&P , NPO status , Patient's Chart, lab work & pertinent test results  History of Anesthesia Complications (+) PONVNegative for: history of anesthetic complications  Airway Mallampati: II TM Distance: >3 FB Neck ROM: full    Dental  (+) Teeth Intact, Dental Advisory Given   Pulmonary sleep apnea and Continuous Positive Airway Pressure Ventilation ,    Pulmonary exam normal       Cardiovascular hypertension, Pt. on home beta blockers     Neuro/Psych PSYCHIATRIC DISORDERS Anxiety negative neurological ROS     GI/Hepatic Neg liver ROS, hiatal hernia, GERD-  Medicated,  Endo/Other  negative endocrine ROS  Renal/GU negative Renal ROS     Musculoskeletal   Abdominal   Peds  Hematology   Anesthesia Other Findings   Reproductive/Obstetrics                        Anesthesia Physical Anesthesia Plan  ASA: III  Anesthesia Plan: General   Post-op Pain Management:    Induction: Intravenous  Airway Management Planned: Oral ETT  Additional Equipment:   Intra-op Plan:   Post-operative Plan: Extubation in OR  Informed Consent: I have reviewed the patients History and Physical, chart, labs and discussed the procedure including the risks, benefits and alternatives for the proposed anesthesia with the patient or authorized representative who has indicated his/her understanding and acceptance.   Dental advisory given  Plan Discussed with: CRNA  Anesthesia Plan Comments:      Anesthesia Quick Evaluation

## 2014-08-05 NOTE — Anesthesia Procedure Notes (Signed)
Anesthesia Regional Block:  Femoral nerve block  Pre-Anesthetic Checklist: ,, timeout performed, Correct Patient, Correct Site, Correct Laterality, Correct Procedure, Correct Position, site marked, Risks and benefits discussed,  Surgical consent,  Pre-op evaluation,  At surgeon's request and post-op pain management  Laterality: Right  Prep: chloraprep       Needles:  Injection technique: Single-shot  Needle Type: Echogenic Stimulator Needle     Needle Length: 5cm 5 cm Needle Gauge: 22 and 22 G    Additional Needles:  Procedures: ultrasound guided (picture in chart) and nerve stimulator Femoral nerve block  Nerve Stimulator or Paresthesia:  Response: quadraceps contraction, 0.45 mA,   Additional Responses:   Narrative:  Start time: 08/05/2014 7:31 AM End time: 08/05/2014 7:41 AM Injection made incrementally with aspirations every 5 mL.  Performed by: Personally  Anesthesiologist: Earnest Bailey, MD  Additional Notes: Functioning IV was confirmed and monitors were applied.  A 67mm 22ga Arrow echogenic stimulator needle was used. Sterile prep and drape,hand hygiene and sterile gloves were used. Ultrasound guidance: relevant anatomy identified, needle position confirmed, local anesthetic spread visualized around nerve(s)., vascular puncture avoided.  Image printed for medical record. Negative aspiration and negative test dose prior to incremental administration of local anesthetic. The patient tolerated the procedure well.

## 2014-08-05 NOTE — Transfer of Care (Signed)
Immediate Anesthesia Transfer of Care Note  Patient: [name]  Procedure(s) Performed: Procedure(s): RIGHT UNICOMPARTMENTAL KNEE (Right)  Patient Location: PACU  Anesthesia Type:General and GA combined with regional for post-op pain  Level of Consciousness: awake  Airway & Oxygen Therapy: Patient Spontanous Breathing and Patient connected to nasal cannula oxygen  Post-op Assessment: Report given to PACU RN and Post -op Vital signs reviewed and stable  Post vital signs: stable  Complications: No apparent anesthesia complications

## 2014-08-05 NOTE — Op Note (Signed)
08/05/2014  10:08 AM  PATIENT:  [name]    PRE-OPERATIVE DIAGNOSIS:  Right knee anteromedial osteoarthritis  POST-OPERATIVE DIAGNOSIS:  Same  PROCEDURE:  RIGHT UNICOMPARTMENTAL KNEE replacement   SURGEON:  Johnny Bridge, MD  PHYSICIAN ASSISTANT: Joya Gaskins, OPA-C, present and scrubbed throughout the case, critical for completion in a timely fashion, and for retraction, instrumentation, and closure.  ANESTHESIA:   General  PREOPERATIVE INDICATIONS:  [name] is a  58 y.o. female with a diagnosis of djd right lnee who failed conservative measures and elected for surgical management.    The risks benefits and alternatives were discussed with the patient preoperatively including but not limited to the risks of infection, bleeding, nerve injury, cardiopulmonary complications, blood clots, the need for revision surgery, among others, and the patient was willing to proceed.  OPERATIVE IMPLANTS: Biomet Oxford mobile bearing medial compartment arthroplasty femur size XS, tibia size AA, bearing size 3.  OPERATIVE FINDINGS: Endstage grade 4 medial compartment osteoarthritis. No significant changes in the lateral or patellofemoral joint.  There were possibly grade 2 changes on the patella and a small area on the lateral femoral trochlea that had some chondral changes, although no exposed bone. The ACL was intact.  OPERATIVE PROCEDURE: The patient was brought to the operating room placed in supine position. General anesthesia was administered. IV antibiotics were given. The lower extremity was placed in the legholder and prepped and draped in usual sterile fashion. Due to to her size, it was fairly difficult to keep the leg within the legholder.  Time out was performed.  The leg was elevated and exsanguinated and the tourniquet was inflated. Initial incision encountered a very large subcutaneous vein, which I ended up ligating with a Vicryl suture because of its size. This is  directly in the midline, and about 3 mm below the skin surface. Anteromedial incision was performed, and I took care to preserve the MCL. Parapatellar incision was carried out, and the osteophytes were excised, along with the medial meniscus and a small portion of the fat pad.  The extra medullary tibial cutting jig was applied, using the spoon and the 107mm G-Clamp and the 2 mm shim, and I took care to protect the anterior cruciate ligament insertion and the tibial spine. The medial collateral ligament was also protected, and I resected my proximal tibia, matching the anatomic slope.   The proximal tibial bony cut was removed in one piece, and I turned my attention to the femur.  The intramedullary femoral rod was placed using the drill, and then using the appropriate reference, I assembled the femoral jig, setting my posterior cutting block. I resected my posterior femur, used the 0 spigot for the anterior femur, and then measured my gap.   I then used the appropriate mill to match the extension gap to the flexion gap. The second milling was at a 4.  The gaps were then measured again with the appropriate feeler gauges. Once I had balanced flexion and extension gaps, I then completed the preparation of the femur.  I milled off the anterior aspect of the distal femur to prevent impingement. I also exposed the tibia, and selected the above-named component, and then used the cutting jig to prepare the keel slot on the tibia. I also used the awl to curette out the bone to complete the preparation of the keel. The back wall was intact.  I then placed trial components, and it was found to have excellent motion, and appropriate balance.  I then cemented the components into place, cementing the tibia first, removing all excess cement, and then cementing the femur.  All loose cement was removed.  The real polyethylene insert was applied manually, and the knee was taken through functional range of motion, and  found to have excellent stability and restoration of joint motion, with excellent balance.  The wounds were irrigated copiously, and the parapatellar tissue closed with Vicryl, followed by Vicryl for the subcutaneous tissue, with routine closure with Steri-Strips and sterile gauze.  The tourniquet was released, and the patient was awakened and extubated and returned to PACU in stable and satisfactory condition. There were no complications.

## 2014-08-05 NOTE — H&P (Signed)
PREOPERATIVE H&P  Chief Complaint: djd right lnee  HPI: [name] is a 58 y.o. female who presents for preoperative history and physical with a diagnosis of djd right lnee. Symptoms are rated as moderate to severe, and have been worsening.  This is significantly impairing activities of daily living.  She has elected for surgical management. She has failed injections, activity modification, anti-inflammatories, and assistive devices.   Past Medical History  Diagnosis Date  . Allergy   . Anxiety   . Hypertension   . Obesity   . Palpitations   . Sleep apnea     cpap  . GERD (gastroesophageal reflux disease)     prilosec once/week  . H/O hiatal hernia   . Arthritis    Past Surgical History  Procedure Laterality Date  . Cholecystectomy    . Abdominal hysterectomy    . Tubal ligation    . Upper gastrointestinal endoscopy  10/2005    hiatal hernia, reflux  . Colonoscopy  multiple  . Wrist fracture surgery  04/14/11    left Dr. Fredna Dow  . Breast surgery      breast biopsy - benign   History   Social History  . Marital Status: Married    Spouse Name: N/A    Number of Children: 2  . Years of Education: N/A   Occupational History  . CMA, [place], [place] History Main Topics  . Smoking status: Never Smoker   . Smokeless tobacco: None  . Alcohol Use: No  . Drug Use: No  . Sexual Activity: None   Other Topics Concern  . None   Social History Narrative   Regular Exercise:  Yes, elliptical   Diet:  Mostly fruits and veggies, some meat.   Enjoys:  Firefighter, crocheting, sewing, playing with grandchildren.   Family History  Problem Relation Age of Onset  . Diabetes Mother   . Hypertension Mother   . Hyperlipidemia Mother   . Heart disease Mother     Angioplasty  . Arthritis Father   . Heart disease Father     CABG  . Hypertension Father   . Hyperlipidemia Father   . Asthma Daughter   . Cancer Paternal Aunt     Colon  . Rectal cancer  Paternal Aunt   . Cancer Paternal Uncle     Lung, smoker   Allergies  Allergen Reactions  . Wheat Rash  . Codeine Itching   Prior to Admission medications   Medication Sig Start Date End Date Taking? Authorizing Provider  amLODipine (NORVASC) 5 MG tablet Take 5 mg by mouth daily.   Yes Historical Provider, MD  aspirin 81 MG tablet Take 81 mg by mouth daily.    Yes Historical Provider, MD  atenolol (TENORMIN) 100 MG tablet Take 100 mg by mouth daily.     Yes Historical Provider, MD  b complex vitamins capsule Take 1 capsule by mouth daily.   Yes Historical Provider, MD  Cholecalciferol (VITAMIN D3) 5000 UNITS CAPS Take by mouth daily.     Yes Historical Provider, MD  citalopram (CELEXA) 20 MG tablet Take one and one half tablets (30 mg) daily.   Yes Historical Provider, MD  fluticasone (FLONASE) 50 MCG/ACT nasal spray Place 1 spray into both nostrils daily as needed for allergies or rhinitis.   Yes Historical Provider, MD  furosemide (LASIX) 20 MG tablet Take 1 tablet by mouth. Twice a week 12/22/11  Yes Historical Provider, MD  gabapentin (NEURONTIN)  300 MG capsule Take 300 mg by mouth at bedtime.   Yes Historical Provider, MD  lisinopril-hydrochlorothiazide (PRINZIDE,ZESTORETIC) 20-12.5 MG per tablet Take 2 tablets by mouth daily.   Yes Historical Provider, MD  magnesium oxide (MAG-OX) 400 MG tablet Take 400 mg by mouth daily.   Yes Historical Provider, MD  VITAMIN A PO Take 1 capsule by mouth daily.   Yes Historical Provider, MD  vitamin C (ASCORBIC ACID) 500 MG tablet Take 500 mg by mouth daily.   Yes Historical Provider, MD  omeprazole (PRILOSEC) 20 MG capsule Take 20 mg by mouth daily as needed.    Historical Provider, MD     Positive ROS: All other systems have been reviewed and were otherwise negative with the exception of those mentioned in the HPI and as above.  Physical Exam: General: Alert, no acute distress Cardiovascular: No pedal edema Respiratory: No cyanosis, no use of  accessory musculature GI: No organomegaly, abdomen is soft and non-tender Skin: No lesions in the area of chief complaint Neurologic: Sensation intact distally Psychiatric: Patient is competent for consent with normal mood and affect Lymphatic: No axillary or cervical lymphadenopathy  MUSCULOSKELETAL: right knee with varus, 0-125 degrees, positive pseudolaxity with crepitance  Assessment: djd right lnee  Plan: Plan for Procedure(s): RIGHT UNICOMPARTMENTAL KNEE vs. TKA  The risks benefits and alternatives were discussed with the patient including but not limited to the risks of nonoperative treatment, versus surgical intervention including infection, bleeding, nerve injury,  blood clots, cardiopulmonary complications, morbidity, mortality, among others, and they were willing to proceed.   Johnny Bridge, MD Cell [phone]   08/05/2014 7:10 AM

## 2014-08-05 NOTE — Discharge Instructions (Signed)
Diet: As you were doing prior to hospitalization  ° °Shower:  May shower but keep the wounds dry, use an occlusive plastic wrap, NO SOAKING IN TUB.  If the bandage gets wet, change with a clean dry gauze. ° °Dressing:  You may change your dressing 3-5 days after surgery.  Then change the dressing daily with sterile gauze dressing.   ° °There are sticky tapes (steri-strips) on your wounds and all the stitches are absorbable.  Leave the steri-strips in place when changing your dressings, they will peel off with time, usually 2-3 weeks. ° °Activity:  Increase activity slowly as tolerated, but follow the weight bearing instructions below.  No lifting or driving for 6 weeks. ° °Weight Bearing:   As tolerated.   ° °To prevent constipation: you may use a stool softener such as - ° °Colace (over the counter) 100 mg by mouth twice a day  °Drink plenty of fluids (prune juice may be helpful) and high fiber foods °Miralax (over the counter) for constipation as needed.   ° °Itching:  If you experience itching with your medications, try taking only a single pain pill, or even half a pain pill at a time.  You may take up to 10 pain pills per day, and you can also use benadryl over the counter for itching or also to help with sleep.  ° °Precautions:  If you experience chest pain or shortness of breath - call 911 immediately for transfer to the hospital emergency department!! ° °If you develop a fever greater that 101 F, purulent drainage from wound, increased redness or drainage from wound, or calf pain -- Call the office at [phone]                                                °Follow- Up Appointment:  Please call for an appointment to be seen in 2 weeks [place] - [phone] ° ° ° ° ° °

## 2014-08-05 NOTE — Anesthesia Postprocedure Evaluation (Signed)
  Anesthesia Post-op Note  Patient: [name]  Procedure(s) Performed: Procedure(s): RIGHT UNICOMPARTMENTAL KNEE (Right)  Patient Location: PACU  Anesthesia Type:General  Level of Consciousness: awake, alert  and oriented  Airway and Oxygen Therapy: Patient Spontanous Breathing and Patient connected to nasal cannula oxygen  Post-op Pain: mild  Post-op Assessment: Post-op Vital signs reviewed, Patient's Cardiovascular Status Stable, Respiratory Function Stable, Patent Airway and Pain level controlled  Post-op Vital Signs: stable  Last Vitals:  Filed Vitals:   08/05/14 1200  BP: 156/78  Pulse: 68  Temp: 36.7 C  Resp: 14    Complications: No apparent anesthesia complications

## 2014-08-05 NOTE — Evaluation (Signed)
Physical Therapy Evaluation Patient Details Name: [name] MRN: [id number] DOB: Jun 14, 1956 Today's Date: 08/05/2014   History of Present Illness  Patient is a 58 y/o female s/p right unicompartmental knee replacement. PMH of HTN. GERD, arthritis. WBAT RLE.  Clinical Impression  Patient is s/p R TKA presents with post surgical deficits of RLE and pain limiting safe mobility. Pt tolerated short distance ambulation and transfers with Min A for safety. Highly motivated. Education provided on how to use spirometer and on pursed lip breathing. Pt's 02 sats dropped to 86% during evaluation on RA however able to resolve to >92% with cues for pursed lip breathing. 02 donned post treatment. Encouraged spirometer use. Instructed pt in exercises to perform to begin HEP. Pt demonstrated understanding. Provided HEP handout. Pt would benefit from acute PT and follow up HHPT to improve transfers, gait, balance and overall safe mobility so pt can maximize independence and return to PLOF.    Follow Up Recommendations Home health PT;Supervision/Assistance - 24 hour    Equipment Recommendations  Rolling walker with 5" wheels;3in1 (PT)    Recommendations for Other Services       Precautions / Restrictions Precautions Precautions: Knee;Fall Precaution Booklet Issued: Yes (comment) Precaution Comments: Provided HEP handout. Required Braces or Orthoses: Knee Immobilizer - Right Knee Immobilizer - Right: On when out of bed or walking Restrictions Weight Bearing Restrictions: Yes RLE Weight Bearing: Weight bearing as tolerated      Mobility  Bed Mobility Overal bed mobility: Needs Assistance Bed Mobility: Rolling;Sidelying to Sit;Sit to Supine Rolling: Supervision Sidelying to sit: Supervision;HOB elevated   Sit to supine: Min assist   General bed mobility comments: VC's for technique; use of rail for support. Increased time. Required assist mobilizing RLE to return to supine. Instructed pt to  use LLE to assist RLE into bed.  Transfers Overall transfer level: Needs assistance Equipment used: Rolling walker (2 wheeled) Transfers: Sit to/from [facility] Sit to Stand: Min guard Stand pivot transfers: Min assist       General transfer comment: Min guard assist to stand for safety. VC for hand placement, use of body momentum and technique, Able to perform SPT bed<--> BSC Min A for balance.  Ambulation/Gait Ambulation/Gait assistance: Min guard Ambulation Distance (Feet): 4 Feet (+ 4') Assistive device: Rolling walker (2 wheeled) Gait Pattern/deviations: Step-to pattern;Decreased stride length;Decreased stance time - right;Antalgic Gait velocity: decreased   General Gait Details: Antalgic gait pattern due to pain and impaired sensation/strength of RLE. Pt reports RLE feeling "like it could buckle". Able to side step along side bed ~4' with Min guard assist. Able to ambulate forward ~4' and retro gait with Min A for balance.  Sa02 decreased to 86% on RA. Able to improve with cues for pursed lip breathing.  Stairs            Wheelchair Mobility    Modified Rankin (Stroke Patients Only)       Balance Overall balance assessment: Needs assistance   Sitting balance-Leahy Scale: Good Sitting balance - Comments: requires assist donning socks. Able to reach outside BoS to grab drink on tray without LOB.   Standing balance support: During functional activity Standing balance-Leahy Scale: Fair Standing balance comment: Requires UE support during transfers due to weakness in RLE and balance deficits.                             Pertinent Vitals/Pain Pain Assessment: 0-10 Pain Score:  3  Pain Location: right knee Pain Descriptors / Indicators: Sore Pain Intervention(s): Monitored during session;Repositioned;Ice applied    Home Living Family/patient expects to be discharged to:: Private residence Living Arrangements: Spouse/significant  other Available Help at Discharge: Family;Available PRN/intermittently Type of Home: House Home Access: Stairs to enter Entrance Stairs-Rails: Right Entrance Stairs-Number of Steps: 2 Home Layout: One level;Laundry or work area in basement (Pt does not need to go into basement.) Home Equipment: Kasandra Knudsen - single point Additional Comments: RW and 3in1 has been ordered per notes.    Prior Function Level of Independence: Independent         Comments: Pt works as Psychologist, sport and exercise. (I) with ADLs, IADLs.     Hand Dominance        Extremity/Trunk Assessment   Upper Extremity Assessment: Overall WFL for tasks assessed           Lower Extremity Assessment: RLE deficits/detail;Generalized weakness RLE Deficits / Details: Limited AROM/strength secondary to being post op and due to effects of femoral nerve block.       Communication   Communication: No difficulties  Cognition Arousal/Alertness: Awake/alert Behavior During Therapy: WFL for tasks assessed/performed Overall Cognitive Status: Within Functional Limits for tasks assessed                      General Comments General comments (skin integrity, edema, etc.): Surgical site not visible due to ace wrap.    Exercises Total Joint Exercises Ankle Circles/Pumps: Both;10 reps;Seated Quad Sets: Both;10 reps;Supine Gluteal Sets: Both;10 reps;Seated      Assessment/Plan    PT Assessment Patient needs continued PT services  PT Diagnosis Acute pain;Generalized weakness;Difficulty walking   PT Problem List Decreased strength;Cardiopulmonary status limiting activity;Pain;Decreased range of motion;Impaired sensation;Decreased activity tolerance;Obesity;Decreased mobility;Decreased knowledge of precautions;Decreased balance;Decreased safety awareness;Decreased knowledge of use of DME  PT Treatment Interventions DME instruction;Balance training;Gait training;Stair training;Patient/family education;Functional mobility  training;Therapeutic activities;Therapeutic exercise   PT Goals (Current goals can be found in the Care Plan section) Acute Rehab PT Goals Patient Stated Goal: to get moving PT Goal Formulation: With patient Time For Goal Achievement: 08/19/14 Potential to Achieve Goals: Good    Frequency 7X/week   Barriers to discharge        Co-evaluation               End of Session Equipment Utilized During Treatment: Gait belt Activity Tolerance: Patient tolerated treatment well Patient left: in bed;with call bell/phone within reach;with bed alarm set;with family/visitor present Nurse Communication: Mobility status         Time: 1525-1600 PT Time Calculation (min): 35 min   Charges:   PT Evaluation $Initial PT Evaluation Tier I: 1 Procedure PT Treatments $Therapeutic Activity: 8-22 mins   PT G CodesCandy Sledge A 08/05/2014, 4:10 PM Candy Sledge, [place], DPT [phone]

## 2014-08-06 ENCOUNTER — Encounter (HOSPITAL_COMMUNITY): Payer: Self-pay | Admitting: General Practice

## 2014-08-06 NOTE — Discharge Summary (Addendum)
Physician Discharge Summary  Patient ID: [name] MRN: [id number] DOB/AGE: 1956/06/24 58 y.o.  Admit date: 08/05/2014 Discharge date: 08/06/2014  Admission Diagnoses:  Osteoarthritis of right knee  Discharge Diagnoses:  Principal Problem:   Osteoarthritis of right knee Active Problems:   Morbid obesity   Knee osteoarthritis   Past Medical History  Diagnosis Date  . Allergy   . Anxiety   . Hypertension   . Obesity   . Palpitations   . Sleep apnea     cpap  . GERD (gastroesophageal reflux disease)     prilosec once/week  . H/O hiatal hernia   . Arthritis   . Osteoarthritis of right knee 08/05/2014    Surgeries: Procedure(s): RIGHT UNICOMPARTMENTAL KNEE on 08/05/2014   Consultants (if any):    Discharged Condition: Improved  Hospital Course: [name] is an 58 y.o. female who was admitted 08/05/2014 with a diagnosis of Osteoarthritis of right knee and went to the operating room on 08/05/2014 and underwent the above named procedures.    She was given perioperative antibiotics:      Anti-infectives   Start     Dose/Rate Route Frequency Ordered Stop   08/05/14 1400  ceFAZolin (ANCEF) IVPB 2 g/50 mL premix     2 g 100 mL/hr over 30 Minutes Intravenous Every 6 hours 08/05/14 1309 08/05/14 2210   08/05/14 0600  ceFAZolin (ANCEF) 3 g in dextrose 5 % 50 mL IVPB     3 g 160 mL/hr over 30 Minutes Intravenous 30 min pre-op 08/04/14 1427 08/05/14 0810    .  She was given sequential compression devices, early ambulation, and xarelto for DVT prophylaxis.  She benefited maximally from the hospital stay and there were no complications.  DC today is pending another PT visit, to ensure adequate ambulation.  Recent vital signs:  Filed Vitals:   08/06/14 0519  BP: 115/42  Pulse: 68  Temp: 98 F (36.7 C)  Resp: 16    Recent laboratory studies:  Lab Results  Component Value Date   HGB 14.4 07/25/2014   HGB 14.5 04/14/2011   Lab Results  Component Value Date    WBC 6.8 07/25/2014   PLT 268 07/25/2014   Lab Results  Component Value Date   INR 1.07 07/25/2014   Lab Results  Component Value Date   NA 140 07/25/2014   K 4.2 07/25/2014   CL 101 07/25/2014   CO2 26 07/25/2014   BUN 12 07/25/2014   CREATININE 0.54 07/25/2014   GLUCOSE 103* 07/25/2014    Discharge Medications:     Medication List         amLODipine 5 MG tablet  Commonly known as:  NORVASC  Take 5 mg by mouth daily.     aspirin 81 MG tablet  Take 81 mg by mouth daily.     atenolol 100 MG tablet  Commonly known as:  TENORMIN  Take 100 mg by mouth daily.     b complex vitamins capsule  Take 1 capsule by mouth daily.     baclofen 10 MG tablet  Commonly known as:  LIORESAL  Take 1 tablet (10 mg total) by mouth 3 (three) times daily. As needed for muscle spasm     citalopram 20 MG tablet  Commonly known as:  CELEXA  Take one and one half tablets (30 mg) daily.     fluticasone 50 MCG/ACT nasal spray  Commonly known as:  FLONASE  Place 1 spray into both nostrils  daily as needed for allergies or rhinitis.     furosemide 20 MG tablet  Commonly known as:  LASIX  Take 1 tablet by mouth. Twice a week     gabapentin 300 MG capsule  Commonly known as:  NEURONTIN  Take 300 mg by mouth at bedtime.     lisinopril-hydrochlorothiazide 20-12.5 MG per tablet  Commonly known as:  PRINZIDE,ZESTORETIC  Take 2 tablets by mouth daily.     magnesium oxide 400 MG tablet  Commonly known as:  MAG-OX  Take 400 mg by mouth daily.     omeprazole 20 MG capsule  Commonly known as:  PRILOSEC  Take 20 mg by mouth daily as needed.     ondansetron 4 MG tablet  Commonly known as:  ZOFRAN  Take 1 tablet (4 mg total) by mouth every 8 (eight) hours as needed for nausea or vomiting.     oxyCODONE-acetaminophen 10-325 MG per tablet  Commonly known as:  PERCOCET  Take 1-2 tablets by mouth every 6 (six) hours as needed for pain. MAXIMUM TOTAL ACETAMINOPHEN DOSE IS 4000 MG PER DAY     rivaroxaban 10 MG  Tabs tablet  Commonly known as:  XARELTO  Take 1 tablet (10 mg total) by mouth daily.     sennosides-docusate sodium 8.6-50 MG tablet  Commonly known as:  SENOKOT-S  Take 2 tablets by mouth daily.     VITAMIN A PO  Take 1 capsule by mouth daily.     vitamin C 500 MG tablet  Commonly known as:  ASCORBIC ACID  Take 500 mg by mouth daily.     Vitamin D3 5000 UNITS Caps  Take by mouth daily.        Diagnostic Studies: Dg Chest 2 View  07/25/2014   CLINICAL DATA:  Hypertension  EXAM: CHEST  2 VIEW  COMPARISON:  None.  FINDINGS: Normal cardiac and mediastinal contours. Mild tortuosity of the thoracic aorta. Minimal bibasilar atelectasis and or scarring. No pleural effusion or pneumothorax. Mid thoracic spine degenerative change.  IMPRESSION: No acute cardiopulmonary process.   Electronically Signed   By: Lovey Newcomer M.D.   On: 07/25/2014 11:30   Dg Knee Right Port  08/05/2014   CLINICAL DATA:  Status post arthroplasty  EXAM: PORTABLE LEFT KNEE - 1-2 VIEW  COMPARISON:  None.  FINDINGS: Postsurgical changes are noted in the medial joint space of the right knee. A partial knee replacement is seen. A small joint effusion is noted as well as some air in surgical bed. No acute abnormality is noted.   Electronically Signed   By: Inez Catalina M.D.   On: 08/05/2014 12:34    Disposition: 01-Home or Self Care    Follow-up Information   Follow up with Johnny Bridge, MD. Schedule an appointment as soon as possible for a visit in 2 weeks.   Specialty:  Orthopedic Surgery   Contact information:   [place] [address] [phone]        Signed: Johnny Bridge 08/06/2014, 7:01 AM

## 2014-08-06 NOTE — Progress Notes (Addendum)
     Subjective:  Patient reports pain as mild.  Walked to bathroom and back, but limited function.  Objective:   VITALS:   Filed Vitals:   08/06/14 0000 08/06/14 0131 08/06/14 0400 08/06/14 0519  BP:  146/58  115/42  Pulse:  79  68  Temp:  98.9 F (37.2 C)  98 F (36.7 C)  TempSrc:      Resp: 14 14 16 16   Height:      Weight:      SpO2: 92% 94% 96% 95%    Neurologically intact Dorsiflexion/Plantar flexion intact Incision: dressing C/D/I   Lab Results  Component Value Date   WBC 6.8 07/25/2014   HGB 14.4 07/25/2014   HCT 41.7 07/25/2014   MCV 89.7 07/25/2014   PLT 268 07/25/2014   BMET    Component Value Date/Time   NA 140 07/25/2014 1115   K 4.2 07/25/2014 1115   CL 101 07/25/2014 1115   CO2 26 07/25/2014 1115   GLUCOSE 103* 07/25/2014 1115   BUN 12 07/25/2014 1115   CREATININE 0.54 07/25/2014 1115   CALCIUM 9.4 07/25/2014 1115   GFRNONAA >90 07/25/2014 1115   GFRAA >90 07/25/2014 1115     Assessment/Plan: 1 Day Post-Op   Principal Problem:   Osteoarthritis of right knee Active Problems:   Morbid obesity   Knee osteoarthritis Estimated body mass index is 49.64 kg/(m^2) as calculated from the following:   Height as of this encounter: 5' 5.5" (1.664 m).   Weight as of this encounter: 137.44 kg (303 lb).   Advance diet Up with therapy Discharge home with home health possibly later today vs. Tomorrow depending on how she does with PT.   Garvey Westcott P 08/06/2014, 7:01 AM   Marchia Bond, MD Cell [phone]

## 2014-08-06 NOTE — Progress Notes (Signed)
Discharge instructions given. Pt verbalized understanding and all questions were answered.  

## 2014-08-06 NOTE — Progress Notes (Signed)
08/06/14 Informed by Stanton Kidney with [facility] that they do not service patient's county. Spoke with patient and she chose [facility] from [place]. Contacted Geraldyn Shain Hickling at [facility] and set up HHPT. Fuller Plan RN, BSN, CCM

## 2014-08-06 NOTE — Progress Notes (Signed)
Occupational Therapy Evaluation Patient Details Name: [name] MRN: [id number] DOB: 01/12/1956 Today's Date: 08/06/2014    History of Present Illness Patient is Knapp 58 y/o female s/p right unicompartmental knee replacement. PMH of HTN. GERD, arthritis. WBAT RLE. (Simultaneous filing. User may not have seen previous data.)   Clinical Impression   Pt educated on all ADL techniques and is ready for discharge from OT standpoint.     Follow Up Recommendations  No OT follow up;Supervision - Intermittent    Equipment Recommendations  3 in 1 bedside comode    Recommendations for Other Services       Precautions / Restrictions Precautions Precautions: Knee;Fall (Simultaneous filing. User may not have seen previous data.) Precaution Booklet Issued: Yes (comment) Required Braces or Orthoses: Knee Immobilizer - Right (Simultaneous filing. User may not have seen previous data.) Knee Immobilizer - Right: On when out of bed or walking;Discontinue once straight leg raise with < 10 degree lag (Simultaneous filing. User may not have seen previous data.) Restrictions Weight Bearing Restrictions: Yes (Simultaneous filing. User may not have seen previous data.) RLE Weight Bearing: Weight bearing as tolerated      Mobility Bed Mobility Overal bed mobility: Modified Independent             General bed mobility comments: With KI on pt able to be Mod I.    Transfers Overall transfer level: Needs assistance Equipment used: Rolling walker (2 wheeled) Transfers: Sit to/from Stand Sit to Stand: Supervision         General transfer comment: Cues for UE use.  No physical Knapp needed.      Balance                                            ADL Overall ADL's : Needs assistance/impaired         Upper Body Bathing: Set up;Supervision/ safety   Lower Body Bathing: Minimal assistance   Upper Body Dressing : Set up;Supervision/safety   Lower Body Dressing: Minimal  assistance   Toilet Transfer: Supervision/safety;BSC;RW   Toileting- Clothing Manipulation and Hygiene: Supervision/safety;Sit to/from stand   Tub/ Shower Transfer: Walk-in shower;Min guard;Shower seat;Rolling walker   Functional mobility during ADLs: Supervision/safety;Rolling walker General ADL Comments: Patient practiced walk-in shower transfer. Husband present and knows how to Knapp pt. Pt deferred practicing toileting transfer/toileting. Reviewed AE for LB self-care, pt has purchased ortho kit from gift shop.      Vision                     Perception     Praxis      Pertinent Vitals/Pain Pain Assessment: No/denies pain (Simultaneous filing. User may not have seen previous data.) Pain Score: 2  Pain Location: R knee Pain Descriptors / Indicators: Aching Pain Intervention(s): Premedicated before session;Repositioned     Hand Dominance Right   Extremity/Trunk Assessment Upper Extremity Assessment Upper Extremity Assessment: Overall WFL for tasks assessed           Communication Communication Communication: No difficulties   Cognition Arousal/Alertness: Awake/alert (Simultaneous filing. User may not have seen previous data.) Behavior During Therapy: [facility] for tasks assessed/performed (Simultaneous filing. User may not have seen previous data.) Overall Cognitive Status: Within Functional Limits for tasks assessed (Simultaneous filing. User may not have seen previous data.)  General Comments       Exercises       Shoulder Instructions      Home Living Family/patient expects to be discharged to:: Private residence Living Arrangements: Spouse/significant other Available Help at Discharge: Family;Available PRN/intermittently Type of Home: House Home Access: Stairs to enter [employer] of Steps: 2 Entrance Stairs-Rails: Right Home Layout: One level;Laundry or work area in basement     [employer] Shower/Tub: Emergency planning/management officer: [place] Accessibility: Yes How Accessible: Accessible via walker Home Equipment: Environmental consultant - 2 wheels;Adaptive equipment;Bedside commode;Shower seat - built in;Hand held Architectural technologist: Reacher;Sock aid;Long-handled shoe horn;Long-handled sponge        Prior Functioning/Environment Level of Independence: Independent        Comments: Pt works as Psychologist, sport and exercise. (I) with ADLs, IADLs.    OT Diagnosis:     OT Problem List:     OT Treatment/Interventions:      OT Goals(Current goals can be found in the care plan section) Acute Rehab OT Goals Patient Stated Goal: to go home today (Simultaneous filing. User may not have seen previous data.)  OT Frequency:     Barriers to D/C:            Co-evaluation              End of Session Equipment Utilized During Treatment: Gait belt;Rolling walker;Right knee immobilizer  Activity Tolerance: Patient tolerated treatment well Patient left: in chair;with call bell/phone within reach;with family/visitor present   Time: 1450-1504 OT Time Calculation (min): 14 min Charges:  OT General Charges $OT Visit: 1 Procedure OT Evaluation $Initial OT Evaluation Tier I: 1 Procedure OT Treatments $Self Care/Home Management : 8-22 mins G-Codes:    [name] Sep 05, 2014, 3:13 PM

## 2014-08-06 NOTE — Progress Notes (Signed)
CARE MANAGEMENT NOTE 08/06/2014  Patient:  [name], [name]   Account Number:  [id number]  Date Initiated:  08/06/2014  Documentation initiated by:  [facility]  Subjective/Objective Assessment:   admitted s/p rt TKA     Action/Plan:   PT/OT evals- recommended HHPT   Anticipated DC Date:  08/06/2014   Anticipated DC Plan:  [place]  CM consult      [facility] Choice  [place]   Choice offered to / List presented to:  C-1 Patient   DME arranged  3-N-1  Agency      DME agency  [employer]     [place] arranged  HH-2 PT      [place]   Status of service:  Completed, signed off Medicare Important Message given?   (If response is "NO", the following Medicare IM given date fields will be blank) Date Medicare IM given:   Medicare IM given by:   Date Additional Medicare IM given:   Additional Medicare IM given by:    Discharge Disposition:  [place]  Per UR Regulation:    If discussed at Long Length of Stay Meetings, dates discussed:    Comments:  08/06/14 Set up by MD office with [facility] for [place] and [employer] for equipment. Spoke with patient, no change in d/c plan, family available to assist. [employer]. delivered rolling walker and 3N1 to room. Fuller Plan RN, BSN, CCM

## 2014-08-06 NOTE — Progress Notes (Signed)
Physical Therapy Treatment Patient Details Name: [name] MRN: [id number] DOB: March 05, 1956 Today's Date: 08/06/2014    History of Present Illness Patient is a 58 y/o female s/p right unicompartmental knee replacement. PMH of HTN. GERD, arthritis. WBAT RLE.    PT Comments    Pt moving well and very motivated this am.  Will see pt again today to trial stairs and ensure no further questions, then anticipate pt will be ready for D/C from PT stand point.    Follow Up Recommendations  Home health PT;Supervision/Assistance - 24 hour     Equipment Recommendations  Rolling walker with 5" wheels;3in1 (PT)    Recommendations for Other Services       Precautions / Restrictions Precautions Precautions: Knee;Fall Precaution Booklet Issued: Yes (comment) Required Braces or Orthoses: Knee Immobilizer - Right Knee Immobilizer - Right: On when out of bed or walking;Discontinue once straight leg raise with < 10 degree lag Restrictions Weight Bearing Restrictions: Yes RLE Weight Bearing: Weight bearing as tolerated    Mobility  Bed Mobility Overal bed mobility: Needs Assistance Bed Mobility: Supine to Sit     Supine to sit: Supervision;HOB elevated     General bed mobility comments: pt does utilize bed rails, but no physical A needed.    Transfers Overall transfer level: Needs assistance Equipment used: Rolling walker (2 wheeled) Transfers: Sit to/from Stand Sit to Stand: Min guard         General transfer comment: cues for UE use and positioning LEs prior to sitting.    Ambulation/Gait Ambulation/Gait assistance: Min guard Ambulation Distance (Feet): 160 Feet Assistive device: Rolling walker (2 wheeled) Gait Pattern/deviations: Step-through pattern;Decreased stride length;Wide base of support     General Gait Details: pt moving well this am.  cueing for upright posture and encouragement.     Stairs            Wheelchair Mobility    Modified Rankin (Stroke  Patients Only)       Balance                                    Cognition Arousal/Alertness: Awake/alert Behavior During Therapy: WFL for tasks assessed/performed Overall Cognitive Status: Within Functional Limits for tasks assessed                      Exercises Total Joint Exercises Ankle Circles/Pumps: AROM;Both;10 reps Quad Sets: AROM;Right;10 reps Hip ABduction/ADduction: AROM;Right;10 reps Long Arc Quad: AROM;Right;10 reps Knee Flexion: AROM;Right;10 reps Goniometric ROM: ~5 - 85    General Comments        Pertinent Vitals/Pain Pain Assessment: 0-10 Pain Score: 3  Pain Location: R Knee Pain Descriptors / Indicators: Aching Pain Intervention(s): Premedicated before session;Repositioned    Home Living                      Prior Function            PT Goals (current goals can now be found in the care plan section) Acute Rehab PT Goals Patient Stated Goal: to get moving PT Goal Formulation: With patient Time For Goal Achievement: 08/19/14 Potential to Achieve Goals: Good Progress towards PT goals: Progressing toward goals    Frequency  7X/week    PT Plan Current plan remains appropriate    Co-evaluation             End of  Session Equipment Utilized During Treatment: Gait belt Activity Tolerance: Patient tolerated treatment well Patient left: in chair;with call bell/phone within reach     Time: 0758-0842 PT Time Calculation (min): 44 min  Charges:  $Gait Training: 8-22 mins $Therapeutic Exercise: 8-22 mins                    G CodesCatarina Hartshorn, [place] 08/06/2014, 8:48 AM

## 2014-08-06 NOTE — Progress Notes (Signed)
Physical Therapy Treatment Patient Details Name: [name] MRN: [id number] DOB: 01/19/1956 Today's Date: 08/06/2014    History of Present Illness Patient is a 58 y/o female s/p right unicompartmental knee replacement. PMH of HTN. GERD, arthritis. WBAT RLE.    PT Comments    Pt continues to move great and is ready for D/C from PT stand point.  Feel pt will continue to make great progress.    Follow Up Recommendations  Home health PT;Supervision/Assistance - 24 hour     Equipment Recommendations  Rolling walker with 5" wheels;3in1 (PT)    Recommendations for Other Services       Precautions / Restrictions Precautions Precautions: Knee;Fall Precaution Booklet Issued: Yes (comment) Required Braces or Orthoses: Knee Immobilizer - Right Knee Immobilizer - Right: On when out of bed or walking;Discontinue once straight leg raise with < 10 degree lag Restrictions Weight Bearing Restrictions: Yes RLE Weight Bearing: Weight bearing as tolerated    Mobility  Bed Mobility Overal bed mobility: Modified Independent             General bed mobility comments: With KI on pt able to be Mod I.    Transfers Overall transfer level: Needs assistance Equipment used: Rolling walker (2 wheeled) Transfers: Sit to/from Stand Sit to Stand: Supervision         General transfer comment: Cues for UE use.  No physical A needed.    Ambulation/Gait Ambulation/Gait assistance: Supervision Ambulation Distance (Feet): 10 Feet (x2) Assistive device: Rolling walker (2 wheeled) Gait Pattern/deviations: Step-through pattern;Decreased stride length     General Gait Details: pt continues to move well without physical A.  [place] good safety.     Stairs Stairs: Yes Stairs assistance: Min assist Stair Management: No rails;One rail Right;Step to pattern;Sideways;Backwards;With walker Number of Stairs: 2 (x2) General stair comments: cues for technique with R rail and then with RW.  pt's  husband present and A pt during stair gait.    Wheelchair Mobility    Modified Rankin (Stroke Patients Only)       Balance                                    Cognition Arousal/Alertness: Awake/alert Behavior During Therapy: WFL for tasks assessed/performed Overall Cognitive Status: Within Functional Limits for tasks assessed                      Exercises      General Comments        Pertinent Vitals/Pain Pain Assessment: 0-10 Pain Score: 2  Pain Location: R knee Pain Descriptors / Indicators: Aching Pain Intervention(s): Premedicated before session;Repositioned    Home Living                      Prior Function            PT Goals (current goals can now be found in the care plan section) Acute Rehab PT Goals Patient Stated Goal: to get moving PT Goal Formulation: With patient Time For Goal Achievement: 08/19/14 Potential to Achieve Goals: Good Progress towards PT goals: Progressing toward goals    Frequency  7X/week    PT Plan Current plan remains appropriate    Co-evaluation             End of Session Equipment Utilized During Treatment: Gait belt Activity Tolerance: Patient tolerated treatment well Patient left:  in chair;with family/visitor present (with OT in Gym)     Time: 8329-1916 PT Time Calculation (min): 14 min  Charges:  $Gait Training: 8-22 mins                    G CodesCatarina Hartshorn, [place] 08/06/2014, 3:11 PM

## 2014-08-11 ENCOUNTER — Encounter (HOSPITAL_COMMUNITY): Payer: Self-pay | Admitting: Orthopedic Surgery

## 2014-12-03 ENCOUNTER — Ambulatory Visit: Payer: Self-pay | Admitting: Family Medicine

## 2015-04-29 DIAGNOSIS — E781 Pure hyperglyceridemia: Secondary | ICD-10-CM | POA: Insufficient documentation

## 2015-06-11 ENCOUNTER — Encounter: Payer: Self-pay | Admitting: Internal Medicine

## 2015-07-27 IMAGING — CR DG KNEE 1-2V PORT*R*
1 series · 1 of 1 positions shown · non-contrast
Comparison: None.

CLINICAL DATA: Status post arthroplasty

EXAM:
PORTABLE LEFT KNEE - 1-2 VIEW

[AP]
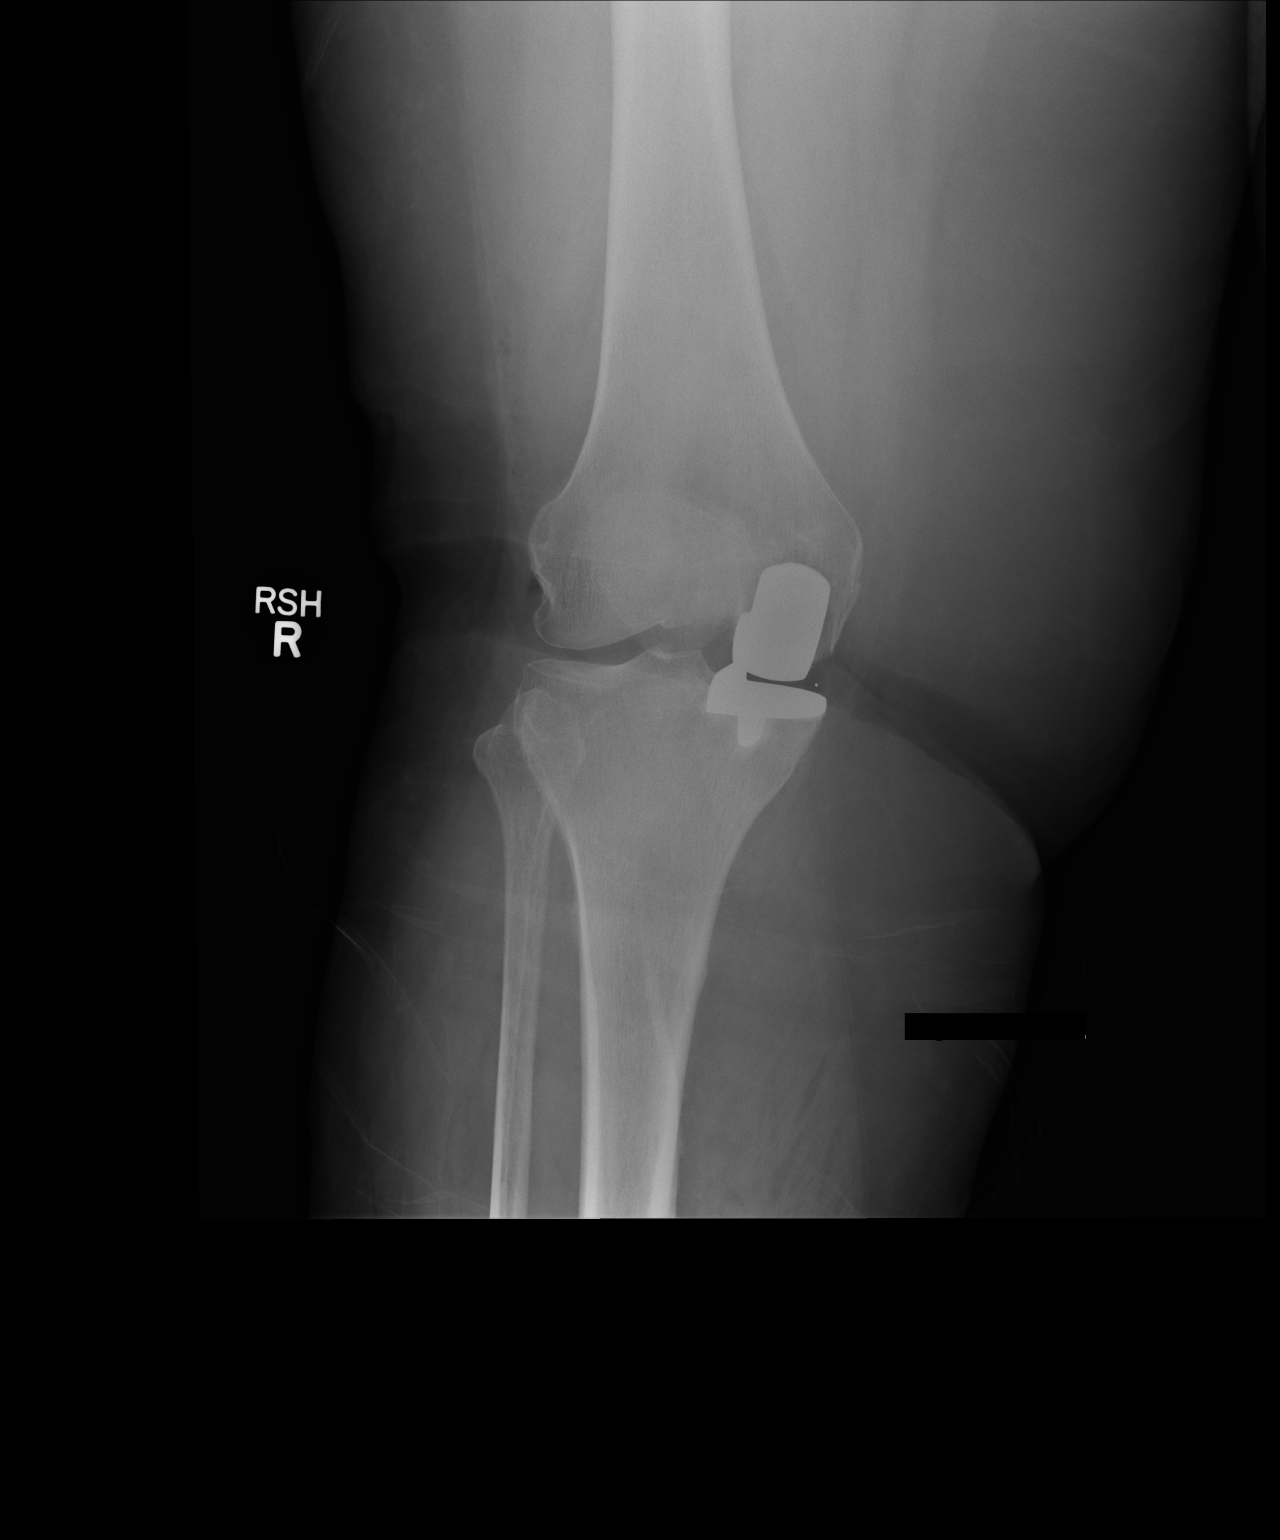

[1 of 1 positions shown; findings below may reference images not displayed]

FINDINGS: Postsurgical changes are noted in the medial joint space of the
right knee. A partial knee replacement is seen. A small joint
effusion is noted as well as some air in surgical bed. No acute
abnormality is noted.

## 2015-11-20 ENCOUNTER — Ambulatory Visit (INDEPENDENT_AMBULATORY_CARE_PROVIDER_SITE_OTHER): Payer: BLUE CROSS/BLUE SHIELD | Admitting: Internal Medicine

## 2015-11-20 ENCOUNTER — Encounter: Payer: Self-pay | Admitting: Internal Medicine

## 2015-11-20 VITALS — BP 140/76 | HR 83 | Temp 99.0°F

## 2015-11-20 DIAGNOSIS — J0110 Acute frontal sinusitis, unspecified: Secondary | ICD-10-CM

## 2015-11-20 MED ORDER — AMOXICILLIN-POT CLAVULANATE 875-125 MG PO TABS: 1.0000 | ORAL_TABLET | Freq: Two times a day (BID) | ORAL | Status: DC

## 2015-11-20 NOTE — Patient Instructions (Signed)

## 2015-11-20 NOTE — Progress Notes (Signed)
HPI  Pt presents to the clinic today with c/o headache, nasal congestion, postnasal drip and cough. This started 3 weeks ago. She is blowing blood tinged yellow mucous out of her nose. The cough is not productive. She denies fever, chills or shortness of breath. She has tried Advil cold and sinus and Delsym with minimal relief.She does have a history of seasonal allergies, she takes Flonase daily. She has had sick contacts. Her flu shot is UTD.  Review of Systems    Past Medical History  Diagnosis Date  . Allergy   . Anxiety   . Hypertension   . Obesity   . Palpitations   . Sleep apnea     cpap  . GERD (gastroesophageal reflux disease)     prilosec once/week  . H/O hiatal hernia   . Arthritis   . Osteoarthritis of right knee 08/05/2014  . Family history of anesthesia complication     PARENTS HAD NAUSEA    Family History  Problem Relation Age of Onset  . Diabetes Mother   . Hypertension Mother   . Hyperlipidemia Mother   . Heart disease Mother     Angioplasty  . Arthritis Father   . Heart disease Father     CABG  . Hypertension Father   . Hyperlipidemia Father   . Asthma Daughter   . Cancer Paternal Aunt     Colon  . Rectal cancer Paternal Aunt   . Cancer Paternal Uncle     Lung, smoker    Social History   Social History  . Marital Status: Married    Spouse Name: N/A  . Number of Children: 2  . Years of Education: N/A   Occupational History  . CMA, [employer], [place] History Main Topics  . Smoking status: Never Smoker   . Smokeless tobacco: Never Used  . Alcohol Use: No  . Drug Use: No  . Sexual Activity: Not on file   Other Topics Concern  . Not on file   Social History Narrative   Regular Exercise:  Yes, elliptical   Diet:  Mostly fruits and veggies, some meat.   Enjoys:  Firefighter, crocheting, sewing, playing with grandchildren.    Allergies  Allergen Reactions  . Wheat Rash  . Codeine Itching     Constitutional: Positive  headache. Denies fatigue, fever or abrupt weight changes.  HEENT:  Positive facial pain, nasal congestion and sore throat. Denies eye redness, ear pain, ringing in the ears, wax buildup, runny nose or bloody nose. Respiratory: Positive cough. Denies difficulty breathing or shortness of breath.  Cardiovascular: Denies chest pain, chest tightness, palpitations or swelling in the hands or feet.   No other specific complaints in a complete review of systems (except as listed in HPI above).  Objective:  BP 140/76 mmHg  Pulse 83  Temp(Src) 99 F (37.2 C) (Oral)  SpO2 96%   General: Appears her stated age, obese in NAD. HEENT: Head: normal shape and size, frontal sinus tenderness noted; Eyes: sclera white, no icterus, conjunctiva pink; Ears: Tm's gray and intact, normal light reflex; Nose: mucosa boggy and moist, septum midline; Throat/Mouth: + PND. Teeth present, mucosa erythematous and moist, no exudate noted, no lesions or ulcerations noted.  Neck:  No lymphadenopathy noted.  Cardiovascular: Normal rate and rhythm. S1,S2 noted.  No murmur, rubs or gallops noted.  Pulmonary/Chest: Normal effort and positive vesicular breath sounds. No respiratory distress. No wheezes, rales or ronchi noted.  Assessment & Plan:   Acute bacterial sinusitis  Can use a Neti Pot which can be purchased from your local drug store. Flonase 2 sprays each nostril for 3 days and then as needed. Augmentin BID for 10 days  RTC as needed or if symptoms persist.

## 2015-11-24 ENCOUNTER — Ambulatory Visit: Payer: Self-pay | Admitting: Internal Medicine

## 2015-12-16 ENCOUNTER — Encounter (INDEPENDENT_AMBULATORY_CARE_PROVIDER_SITE_OTHER): Payer: BLUE CROSS/BLUE SHIELD | Admitting: Podiatry

## 2015-12-16 NOTE — Progress Notes (Signed)
This encounter was created in error - please disregard.

## 2015-12-23 ENCOUNTER — Ambulatory Visit (INDEPENDENT_AMBULATORY_CARE_PROVIDER_SITE_OTHER): Payer: BLUE CROSS/BLUE SHIELD | Admitting: Podiatry

## 2015-12-23 ENCOUNTER — Encounter: Payer: Self-pay | Admitting: *Deleted

## 2015-12-23 ENCOUNTER — Other Ambulatory Visit: Payer: Self-pay | Admitting: *Deleted

## 2015-12-23 ENCOUNTER — Encounter: Payer: Self-pay | Admitting: Podiatry

## 2015-12-23 VITALS — BP 128/66 | HR 64 | Resp 16

## 2015-12-23 DIAGNOSIS — J302 Other seasonal allergic rhinitis: Secondary | ICD-10-CM | POA: Insufficient documentation

## 2015-12-23 DIAGNOSIS — I10 Essential (primary) hypertension: Secondary | ICD-10-CM | POA: Insufficient documentation

## 2015-12-23 DIAGNOSIS — L603 Nail dystrophy: Secondary | ICD-10-CM

## 2015-12-23 DIAGNOSIS — M549 Dorsalgia, unspecified: Secondary | ICD-10-CM

## 2015-12-23 DIAGNOSIS — F419 Anxiety disorder, unspecified: Secondary | ICD-10-CM | POA: Insufficient documentation

## 2015-12-23 DIAGNOSIS — G8929 Other chronic pain: Secondary | ICD-10-CM | POA: Insufficient documentation

## 2015-12-23 DIAGNOSIS — R7303 Prediabetes: Secondary | ICD-10-CM | POA: Insufficient documentation

## 2015-12-23 DIAGNOSIS — E1159 Type 2 diabetes mellitus with other circulatory complications: Secondary | ICD-10-CM

## 2015-12-23 DIAGNOSIS — E1169 Type 2 diabetes mellitus with other specified complication: Secondary | ICD-10-CM

## 2015-12-23 DIAGNOSIS — E669 Obesity, unspecified: Secondary | ICD-10-CM | POA: Insufficient documentation

## 2015-12-23 DIAGNOSIS — E119 Type 2 diabetes mellitus without complications: Secondary | ICD-10-CM | POA: Insufficient documentation

## 2015-12-23 NOTE — Progress Notes (Signed)
   Subjective:    Patient ID: [name], female    DOB: 08/08/1956, 60 y.o.   MRN: [id number]  HPI: She presents today with a chief complaint of a thickened discolored nail hallux left. States that the nail was loose one day and she trimmed it and it really has not grown back yet. She denies any infection such as a paronychia or abscess. She denies trauma.    Review of Systems  HENT: Positive for sinus pressure.   Musculoskeletal: Positive for arthralgias.  Skin:       Change in nails  Psychiatric/Behavioral: The patient is nervous/anxious.   All other systems reviewed and are negative.      Objective:   Physical Exam: She presents today as a 60 year old white female. I have reviewed her past medical history medications allergies surgery social history review of systems. Vital signs are stable she is alert and oriented 3 very pleasant to talk with. No apparent distress. Pulses are strongly palpable neurologic sensorium is intact per Semmes-Weinstein monofilament. Deep tendon reflexes are intact bilateral and muscle strength +5 over 5 dorsiflexors plantar flexors and inverters and everters bilaterally. Orthopedic evaluation was resolved listhesis into the ankle range of motion without crepitation mild flexible pes planus. Cutaneous evaluation shows supple well-hydrated cutis no signs of tinea pedis or skin breakdown. She does have dry xerotic skin to the distal aspect of the hallux left possibly associated with eczema which she has previously been diagnosed with. The hallux nail left does demonstrate distal lysis but discoloration is evident. She has trimmed the nail back almost to the lunula. Stria in the remaining nail is possibly associated with onychomycosis.      Assessment & Plan:  Assessment: Nail dystrophy hallux left.  Plan: We discussed etiology pathology conservative versus surgical therapies. At this point we took a sample of the nail and skin sent for pathologic evaluation.  Should this come back positive for fungus we will consider oral therapy.

## 2016-02-03 ENCOUNTER — Telehealth: Payer: Self-pay | Admitting: *Deleted

## 2016-02-03 ENCOUNTER — Encounter: Payer: Self-pay | Admitting: Podiatry

## 2016-02-03 ENCOUNTER — Ambulatory Visit (INDEPENDENT_AMBULATORY_CARE_PROVIDER_SITE_OTHER): Payer: BLUE CROSS/BLUE SHIELD | Admitting: Podiatry

## 2016-02-03 DIAGNOSIS — Z79899 Other long term (current) drug therapy: Secondary | ICD-10-CM | POA: Diagnosis not present

## 2016-02-03 DIAGNOSIS — L603 Nail dystrophy: Secondary | ICD-10-CM | POA: Diagnosis not present

## 2016-02-03 MED ORDER — TERBINAFINE HCL 250 MG PO TABS: 250.0000 mg | ORAL_TABLET | Freq: Every day | ORAL | Status: DC

## 2016-02-03 NOTE — Patient Instructions (Signed)

## 2016-02-03 NOTE — Telephone Encounter (Signed)
Pt states her medication was called to the wrong pharmacy, would like it called to the [facility] [employer].  I change to the proper [facility].

## 2016-02-04 ENCOUNTER — Telehealth: Payer: Self-pay | Admitting: *Deleted

## 2016-02-04 ENCOUNTER — Telehealth: Payer: Self-pay | Admitting: Podiatry

## 2016-02-04 LAB — CBC WITH DIFFERENTIAL/PLATELET
BASOS ABS: 0 10*3/uL (ref 0.0–0.2)
Basos: 1 %
EOS (ABSOLUTE): 0.3 10*3/uL (ref 0.0–0.4)
Eos: 5 %
Hematocrit: 41.8 % (ref 34.0–46.6)
Hemoglobin: 14.1 g/dL (ref 11.1–15.9)
Immature Grans (Abs): 0 10*3/uL (ref 0.0–0.1)
Immature Granulocytes: 0 %
LYMPHS: 26 %
Lymphocytes Absolute: 1.7 10*3/uL (ref 0.7–3.1)
MCH: 30.6 pg (ref 26.6–33.0)
MCHC: 33.7 g/dL (ref 31.5–35.7)
MCV: 91 fL (ref 79–97)
Monocytes Absolute: 0.4 10*3/uL (ref 0.1–0.9)
Monocytes: 7 %
NEUTROS ABS: 4.1 10*3/uL (ref 1.4–7.0)
Neutrophils: 61 %
PLATELETS: 303 10*3/uL (ref 150–379)
RBC: 4.61 x10E6/uL (ref 3.77–5.28)
RDW: 13.9 % (ref 12.3–15.4)
WBC: 6.6 10*3/uL (ref 3.4–10.8)

## 2016-02-04 LAB — HEPATIC FUNCTION PANEL
ALT: 30 IU/L (ref 0–32)
AST: 22 IU/L (ref 0–40)
Albumin: 4.3 g/dL (ref 3.6–4.8)
Alkaline Phosphatase: 76 IU/L (ref 39–117)
BILIRUBIN TOTAL: 0.5 mg/dL (ref 0.0–1.2)
Bilirubin, Direct: 0.15 mg/dL (ref 0.00–0.40)
Total Protein: 7 g/dL (ref 6.0–8.5)

## 2016-02-04 NOTE — Telephone Encounter (Signed)
Pt returned your call and I gave her the information that you had left on vm for pt about labs being normal and ok to continue medication.

## 2016-02-04 NOTE — Progress Notes (Signed)
She presents today for positive mycology report.  Objective: Positive onychomycosis.  Assessment: Onychomycosis.  Plan: We will start treatment of her onychomycosis to her toenail plates with Lamisil [id number] mg tablets #30 one by mouth daily I recommended to her that she taken in the evening. We also requested a liver profile and CBC which has since returned completely normal. I will follow-up with her 1 month for another liver profile.

## 2016-02-04 NOTE — Telephone Encounter (Addendum)
-----   Message from Garrel Ridgel, [place] sent at 02/04/2016  7:07 AM EDT ----- Blood work looks perfect and may continue medication.  Left message informing pt of Dr. Stephenie Acres orders.

## 2016-03-09 ENCOUNTER — Ambulatory Visit (INDEPENDENT_AMBULATORY_CARE_PROVIDER_SITE_OTHER): Payer: BLUE CROSS/BLUE SHIELD | Admitting: Podiatry

## 2016-03-09 ENCOUNTER — Encounter: Payer: Self-pay | Admitting: Podiatry

## 2016-03-09 VITALS — BP 136/60 | HR 72 | Resp 18

## 2016-03-09 DIAGNOSIS — Z79899 Other long term (current) drug therapy: Secondary | ICD-10-CM

## 2016-03-09 DIAGNOSIS — B351 Tinea unguium: Secondary | ICD-10-CM | POA: Diagnosis not present

## 2016-03-09 MED ORDER — TERBINAFINE HCL 250 MG PO TABS: 250.0000 mg | ORAL_TABLET | Freq: Every day | ORAL | Status: DC

## 2016-03-09 MED ORDER — TERBINAFINE HCL 250 MG PO TABS
250.0000 mg | ORAL_TABLET | Freq: Every day | ORAL | Status: DC
Start: 2016-03-09 — End: 2016-03-09

## 2016-03-09 NOTE — Progress Notes (Signed)
She presents today after her worst 30 day dose of Lamisil. She denies fever chills nausea vomiting muscle aches joint pain no rashes no itching. She denies any stomach upset. She states that I feel that my toenails look better and itching around the nail has subsided.  Objective: No change in the nail plates visible to me yet.  Assessment: Onychomycosis.  Plan: Long-term treatment with Lamisil therapy. Requested another liver profile today. Prescription for Lamisil 250 mg tablets #90 one by mouth daily was dispensed. Follow up with her in 4 months or she will notify us with questions or concerns.

## 2016-03-10 ENCOUNTER — Telehealth: Payer: Self-pay | Admitting: *Deleted

## 2016-03-10 LAB — CBC WITH DIFFERENTIAL/PLATELET
BASOS ABS: 0 10*3/uL (ref 0.0–0.2)
Basos: 1 %
EOS (ABSOLUTE): 0.2 10*3/uL (ref 0.0–0.4)
EOS: 3 %
Hematocrit: 40.9 % (ref 34.0–46.6)
Hemoglobin: 13.9 g/dL (ref 11.1–15.9)
IMMATURE GRANULOCYTES: 0 %
Immature Grans (Abs): 0 10*3/uL (ref 0.0–0.1)
Lymphocytes Absolute: 1.5 10*3/uL (ref 0.7–3.1)
Lymphs: 25 %
MCH: 30.7 pg (ref 26.6–33.0)
MCHC: 34 g/dL (ref 31.5–35.7)
MCV: 90 fL (ref 79–97)
MONOS ABS: 0.4 10*3/uL (ref 0.1–0.9)
Monocytes: 7 %
NEUTROS PCT: 64 %
Neutrophils Absolute: 4 10*3/uL (ref 1.4–7.0)
PLATELETS: 304 10*3/uL (ref 150–379)
RBC: 4.53 x10E6/uL (ref 3.77–5.28)
RDW: 13.4 % (ref 12.3–15.4)
WBC: 6.2 10*3/uL (ref 3.4–10.8)

## 2016-03-10 LAB — HEPATIC FUNCTION PANEL
ALK PHOS: 80 IU/L (ref 39–117)
ALT: 32 IU/L (ref 0–32)
AST: 28 IU/L (ref 0–40)
Albumin: 4.2 g/dL (ref 3.6–4.8)
Bilirubin Total: 0.5 mg/dL (ref 0.0–1.2)
Bilirubin, Direct: 0.13 mg/dL (ref 0.00–0.40)
TOTAL PROTEIN: 7.1 g/dL (ref 6.0–8.5)

## 2016-03-10 NOTE — Telephone Encounter (Addendum)
-----   Message from Garrel Ridgel, [place] sent at 03/10/2016  2:56 PM EDT ----- Blood work looks good and may continue medication.  Left message informing pt of Orders.

## 2016-04-01 ENCOUNTER — Telehealth: Payer: Self-pay | Admitting: *Deleted

## 2016-04-01 NOTE — Telephone Encounter (Signed)
Received [employer] form for Terbinafine instructions.  Completed form as Terbinafine was prescribed on 03/09/2016, return faxed.

## 2016-05-11 ENCOUNTER — Other Ambulatory Visit: Payer: Self-pay | Admitting: Family Medicine

## 2016-05-11 DIAGNOSIS — Z1231 Encounter for screening mammogram for malignant neoplasm of breast: Secondary | ICD-10-CM

## 2016-06-01 ENCOUNTER — Ambulatory Visit: Payer: BLUE CROSS/BLUE SHIELD | Attending: Family Medicine

## 2016-07-06 ENCOUNTER — Ambulatory Visit
Admission: RE | Admit: 2016-07-06 | Discharge: 2016-07-06 | Disposition: A | Discharge status: Home / Self Care | Payer: BLUE CROSS/BLUE SHIELD | Source: Ambulatory Visit | Attending: Family Medicine | Admitting: Family Medicine

## 2016-07-06 ENCOUNTER — Other Ambulatory Visit: Payer: Self-pay | Admitting: Family Medicine

## 2016-07-06 DIAGNOSIS — Z1231 Encounter for screening mammogram for malignant neoplasm of breast: Secondary | ICD-10-CM | POA: Diagnosis not present

## 2016-07-13 ENCOUNTER — Ambulatory Visit (INDEPENDENT_AMBULATORY_CARE_PROVIDER_SITE_OTHER): Payer: BLUE CROSS/BLUE SHIELD | Admitting: Podiatry

## 2016-07-13 ENCOUNTER — Encounter: Payer: Self-pay | Admitting: Podiatry

## 2016-07-13 DIAGNOSIS — L603 Nail dystrophy: Secondary | ICD-10-CM

## 2016-07-13 MED ORDER — TERBINAFINE HCL 250 MG PO TABS: 250.0000 mg | ORAL_TABLET | Freq: Every day | ORAL | 0 refills | Status: DC

## 2016-07-13 NOTE — Progress Notes (Signed)
She presents today for follow-up of her onychomycosis long-term therapy with Lamisil. She states that they seem to be doing very well she refers to the hallux left. She states that recent drugs added to her regimen would be gabapentin for her diabetic peripheral neuropathy.  Objective: Vital signs are stable alert and oriented 3. Pulses are palpable. Neurologic sensorium is intact. Degenerative flexion intact. Nail appears to be healing very nicely appears to be clear proximally the distal aspect of the nail is broken off.  Assessment: Nail dystrophy onychomycosis hallux left.  Plan: I recommended that we continue use of Lamisil oral therapy one tablet every other day for 2 months. I will follow-up with her in 3 months.

## 2016-10-19 ENCOUNTER — Ambulatory Visit (INDEPENDENT_AMBULATORY_CARE_PROVIDER_SITE_OTHER): Payer: BLUE CROSS/BLUE SHIELD | Admitting: Podiatry

## 2016-10-19 DIAGNOSIS — L603 Nail dystrophy: Secondary | ICD-10-CM | POA: Diagnosis not present

## 2016-10-19 DIAGNOSIS — Z79899 Other long term (current) drug therapy: Secondary | ICD-10-CM

## 2016-10-19 MED ORDER — TERBINAFINE HCL 250 MG PO TABS: 250.0000 mg | ORAL_TABLET | Freq: Every day | ORAL | 0 refills | Status: DC

## 2016-10-19 NOTE — Progress Notes (Signed)
She presents today for follow-up of onychomycosis hallux left. She is approximate 75% grown out. Denies any problem taking medication.  Objective: Vital signs are stable alert and oriented 3. Pulses are palpable. Neurologic is intact. Onychomycosis is still present to the hallux left but appears to be approximately 75-80% resolved.  Assessment: Resolving onychomycosis.  Plan: I'm going to start her back on Lamisil every other day for the next 2 months follow-up with her in 3 months. I also requested another liver profile.

## 2016-10-20 LAB — HEPATIC FUNCTION PANEL
ALK PHOS: 82 IU/L (ref 39–117)
ALT: 36 IU/L — AB (ref 0–32)
AST: 29 IU/L (ref 0–40)
Albumin: 4.4 g/dL (ref 3.6–4.8)
BILIRUBIN TOTAL: 0.4 mg/dL (ref 0.0–1.2)
BILIRUBIN, DIRECT: 0.11 mg/dL (ref 0.00–0.40)
Total Protein: 7.3 g/dL (ref 6.0–8.5)

## 2016-10-21 ENCOUNTER — Telehealth: Payer: Self-pay | Admitting: *Deleted

## 2016-10-21 NOTE — Telephone Encounter (Addendum)
-----   Message from Garrel Ridgel, [place] sent at 10/20/2016  7:02 AM EST ----- Blood work show slight increase in a liver enzyme (ALT).  She should be fine to take the medication (lamisil) the way we discussed as every other day or every third day. 10/21/2016-Left message with Dr. Stephenie Acres orders.

## 2016-12-08 ENCOUNTER — Encounter: Payer: Self-pay | Admitting: Emergency Medicine

## 2016-12-08 ENCOUNTER — Emergency Department: Payer: BLUE CROSS/BLUE SHIELD

## 2016-12-08 ENCOUNTER — Emergency Department
Admission: EM | Admit: 2016-12-08 | Discharge: 2016-12-08 | Disposition: A | Discharge status: Home / Self Care | Payer: BLUE CROSS/BLUE SHIELD | Attending: Emergency Medicine | Admitting: Emergency Medicine

## 2016-12-08 DIAGNOSIS — S52125A Nondisplaced fracture of head of left radius, initial encounter for closed fracture: Secondary | ICD-10-CM | POA: Diagnosis not present

## 2016-12-08 DIAGNOSIS — Z7982 Long term (current) use of aspirin: Secondary | ICD-10-CM | POA: Diagnosis not present

## 2016-12-08 DIAGNOSIS — Y999 Unspecified external cause status: Secondary | ICD-10-CM | POA: Insufficient documentation

## 2016-12-08 DIAGNOSIS — S52092A Other fracture of upper end of left ulna, initial encounter for closed fracture: Secondary | ICD-10-CM | POA: Insufficient documentation

## 2016-12-08 DIAGNOSIS — Z79899 Other long term (current) drug therapy: Secondary | ICD-10-CM | POA: Diagnosis not present

## 2016-12-08 DIAGNOSIS — I10 Essential (primary) hypertension: Secondary | ICD-10-CM | POA: Insufficient documentation

## 2016-12-08 DIAGNOSIS — Y9389 Activity, other specified: Secondary | ICD-10-CM | POA: Insufficient documentation

## 2016-12-08 DIAGNOSIS — W01198A Fall on same level from slipping, tripping and stumbling with subsequent striking against other object, initial encounter: Secondary | ICD-10-CM | POA: Diagnosis not present

## 2016-12-08 DIAGNOSIS — Y929 Unspecified place or not applicable: Secondary | ICD-10-CM | POA: Diagnosis not present

## 2016-12-08 DIAGNOSIS — S6992XA Unspecified injury of left wrist, hand and finger(s), initial encounter: Secondary | ICD-10-CM | POA: Diagnosis present

## 2016-12-08 MED ORDER — LIDOCAINE 5 % EX PTCH: 1.0000 | MEDICATED_PATCH | Freq: Two times a day (BID) | CUTANEOUS | 0 refills | Status: AC

## 2016-12-08 NOTE — Discharge Instructions (Signed)
Acute intra-articular radial head fracture and nondisplaced  diaphyseal fracture proximal ulna.

## 2016-12-08 NOTE — ED Notes (Signed)
Pt was visiting her dad at skilled nursing facility and tripped over phone cord falling on to floor similar to hospital floor. Pt states L elbow/arm pain, holding it up, propping it on her belly. Pt also hit nose, abrasion to bridge of nose, no swelling or deformity noted to nose. States it bled a little bit. Denies LOC. Worried she broke her L arm.

## 2016-12-08 NOTE — ED Provider Notes (Signed)
[facility] Emergency Department Provider Note  ____________________________________________  Time seen: Approximately 8:06 PM  I have reviewed the triage vital signs and the nursing notes.   HISTORY  Chief Complaint Arm Pain    HPI [name] is a 61 y.o. female  that presents to the emergency department with arm pain after tripping over phone cord. Patient states she is not having any sensation changes in fingertips. Patient is able to move wrist and hands normally. Patient states it is painful to straighten arm. Patient states that it is tender to touch over elbow. Patient states that she also hit her nose. No loss of consciousness. Patient has not taken anything for symptoms.   Past Medical History:  Diagnosis Date  . Allergy   . Anxiety   . Arthritis   . Family history of anesthesia complication    PARENTS HAD NAUSEA  . GERD (gastroesophageal reflux disease)    prilosec once/week  . H/O hiatal hernia   . Hypertension   . Obesity   . Osteoarthritis of right knee 08/05/2014  . Palpitations   . Sleep apnea    cpap    Patient Active Problem List   Diagnosis Date Noted  . Anxiety 12/23/2015  . Borderline diabetes mellitus 12/23/2015  . Back pain, chronic 12/23/2015  . Benign essential HTN 12/23/2015  . Obesity, diabetes, and hypertension syndrome ([place]) 12/23/2015  . Allergic rhinitis, seasonal 12/23/2015  . Hypertriglyceridemia 04/29/2015  . Osteoarthritis of right knee 08/05/2014  . Knee osteoarthritis 08/05/2014  . UTI (urinary tract infection) 02/01/2013  . Eczema 11/15/2012  . Personal history of failed conscious sedation 06/29/2011  . GERD (gastroesophageal reflux disease) 06/29/2011  . Morbid obesity ([place]) 06/29/2011  . Cellulitis and abscess 03/28/2011  . BREAST CYST 05/26/2010  . HYPERTENSION 02/12/2007  . ARTHRITIS, BACK 02/09/2007    Past Surgical History:  Procedure Laterality Date  . ABDOMINAL HYSTERECTOMY    .  BREAST BIOPSY Right    neg/stereo  . BREAST EXCISIONAL BIOPSY Right 2003   neg  . BREAST SURGERY     breast biopsy - benign  . CHOLECYSTECTOMY    . COLONOSCOPY  multiple  . PARTIAL KNEE ARTHROPLASTY Right 08/05/2014   DR LANDAU  . PARTIAL KNEE ARTHROPLASTY Right 08/05/2014   Procedure: RIGHT UNICOMPARTMENTAL KNEE;  Surgeon: Johnny Bridge, MD;  Location: [facility];  Service: Orthopedics;  Laterality: Right;  . TUBAL LIGATION    . UPPER GASTROINTESTINAL ENDOSCOPY  10/2005   hiatal hernia, reflux  . WRIST FRACTURE SURGERY  04/14/11   left Dr. Fredna Dow    Prior to Admission medications   Medication Sig Start Date End Date Taking? Authorizing Provider  amLODipine (NORVASC) 5 MG tablet Take 5 mg by mouth daily.    Historical Provider, MD  aspirin 81 MG tablet Take 81 mg by mouth daily.     Historical Provider, MD  atenolol (TENORMIN) 100 MG tablet Take 100 mg by mouth daily.      Historical Provider, MD  b complex vitamins capsule Take 1 capsule by mouth daily.    Historical Provider, MD  Cholecalciferol (VITAMIN D3) 5000 UNITS CAPS Take by mouth daily.      Historical Provider, MD  citalopram (CELEXA) 20 MG tablet Take one and one half tablets (30 mg) daily.    Historical Provider, MD  fluticasone (FLONASE) 50 MCG/ACT nasal spray Place 1 spray into both nostrils daily as needed for allergies or rhinitis.    Historical Provider, MD  furosemide (LASIX) 20 MG tablet Take 1 tablet by mouth. Twice a week 12/22/11   Historical Provider, MD  lidocaine (LIDODERM) 5 % Place 1 patch onto the skin every 12 (twelve) hours. Remove & Discard patch within 12 hours or as directed by MD 12/08/16 12/08/17  Laban Emperor, PA-C  lisinopril-hydrochlorothiazide (PRINZIDE,ZESTORETIC) 20-12.5 MG per tablet Take 2 tablets by mouth daily.    Historical Provider, MD  loratadine (CLARITIN) 10 MG tablet Take by mouth.    Historical Provider, MD  magnesium oxide (MAG-OX) 400 MG tablet Take 400 mg by mouth daily.    Historical  Provider, MD  omeprazole (PRILOSEC) 20 MG capsule Take by mouth.    Historical Provider, MD  terbinafine (LAMISIL) 250 MG tablet Take 1 tablet (250 mg total) by mouth daily. 10/19/16   Max T Hyatt, DPM  VITAMIN A PO Take 1 capsule by mouth daily.    Historical Provider, MD  vitamin C (ASCORBIC ACID) 500 MG tablet Take 500 mg by mouth daily.    Historical Provider, MD    Allergies Wheat and Codeine  Family History  Problem Relation Age of Onset  . Diabetes Mother   . Hypertension Mother   . Hyperlipidemia Mother   . Heart disease Mother     Angioplasty  . Arthritis Father   . Heart disease Father     CABG  . Hypertension Father   . Hyperlipidemia Father   . Asthma Daughter   . Cancer Paternal Aunt     Colon  . Rectal cancer Paternal Aunt   . Cancer Paternal Uncle     Lung, smoker    Social History Social History  Substance Use Topics  . Smoking status: Never Smoker  . Smokeless tobacco: Never Used  . Alcohol use No     Review of Systems  Constitutional: No fever/chills ENT: No upper respiratory complaints. Cardiovascular: No chest pain. Respiratory: No SOB. Gastrointestinal: No abdominal pain.  No nausea, no vomiting.  Skin: Negative for ecchymosis. Neurological: Negative for headaches, numbness or tingling   ____________________________________________   PHYSICAL EXAM:  VITAL SIGNS: ED Triage Vitals  Enc Vitals Group     BP 12/08/16 1829 (!) 154/73     Pulse Rate 12/08/16 1829 73     Resp 12/08/16 1829 19     Temp 12/08/16 1829 97.7 F (36.5 C)     Temp Source 12/08/16 1829 Tympanic     SpO2 12/08/16 1829 94 %     Weight 12/08/16 1829 298 lb (135.2 kg)     Height 12/08/16 1829 5\' 5"  (1.651 m)     Head Circumference --      Peak Flow --      Pain Score 12/08/16 1833 4     Pain Loc --      Pain Edu? --      Excl. in [place]? --      Constitutional: Alert and oriented. Well appearing and in no acute distress. Eyes: Conjunctivae are normal. PERRL.  EOMI. Head: Atraumatic. ENT:      Ears:      Nose: No congestion/rhinnorhea.      Mouth/Throat: Mucous membranes are moist.  Neck: No stridor.   Cardiovascular: Normal rate, regular rhythm. Normal S1 and S2.  Good peripheral circulation. 2+ radial pulses. Respiratory: Normal respiratory effort without tachypnea or retractions. Lungs CTAB. Good air entry to the bases with no decreased or absent breath sounds. Musculoskeletal: Tenderness to palpation over left elbow. No swelling noted. No gross  deformities appreciated. Neurologic:  Normal speech and language. No gross focal neurologic deficits are appreciated. Sensation of fingers intact. Skin:  Skin is warm, dry. No rash noted. Psychiatric: Mood and affect are normal. Speech and behavior are normal. Patient exhibits appropriate insight and judgement.   ____________________________________________   LABS (all labs ordered are listed, but only abnormal results are displayed)  Labs Reviewed - No data to display ____________________________________________  EKG   ____________________________________________  RADIOLOGY Robinette Haines, personally viewed and evaluated these images (plain radiographs) as part of my medical decision making, as well as reviewing the written report by the radiologist.  Dg Elbow Complete Left  Result Date: 12/08/2016 CLINICAL DATA:  The patient's foot became caught in a phone cord resulting in a trip and fall. Left forearm and elbow pain. Initial encounter. EXAM: LEFT ELBOW - COMPLETE 3+ VIEW COMPARISON:  None. FINDINGS: The patient has a nondisplaced fracture through the proximal diaphysis of the ulna. Intra-articular fracture of the anterior radial head is identified. The fracture involves approximately the anterior 25% of the radial head with step-off of 0.3 cm. The elbow is located. No other fracture is identified. Elbow joint effusion noted. IMPRESSION: Acute intra-articular radial head fracture and  nondisplaced diaphyseal fracture proximal ulna as described above. Electronically Signed   By: Inge Rise M.D.   On: 12/08/2016 19:14   Dg Forearm Left  Result Date: 12/08/2016 CLINICAL DATA:  The patient's foot became caught in a phone cord resulting in a trip and fall. Left forearm and elbow pain. Initial encounter. EXAM: LEFT FOREARM - 2 VIEW COMPARISON:  Plain films of the elbow this same day. FINDINGS: Proximal radius and ulna fractures are identified as seen on comparison plain films. The patient also has a remote healed fracture of the distal radius with fixation hardware in place. Remote ulnar styloid fracture is noted. 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 IMPRESSION: Proximal radius and ulnar fractures as seen on dedicated plain films of the elbow this same day. No other acute abnormality. Healed distal radius fracture with fixation hardware in place. First CMC osteoarthritis. Electronically Signed   By: Inge Rise M.D.   On: 12/08/2016 19:15    ____________________________________________    PROCEDURES  Procedure(s) performed:    Procedures    Medications - No data to display   ____________________________________________   INITIAL IMPRESSION / ASSESSMENT AND PLAN / ED COURSE  Pertinent labs & imaging results that were available during my care of the patient were reviewed by me and considered in my medical decision making (see chart for details).  Review of the [place] CSRS was performed in accordance of the [place] prior to dispensing any controlled drugs.     Patient's diagnosis is consistent with radial head fracture and proximal ulna fracture. Exam and vital signs are reassuring. Patient is neurovascularly intact. Patient is going to follow up with her own orthopedic doctor in [place]. Patient was given a splint and a sling. Patient will be discharged home with prescriptions for lidocaine patch. Patient is given ED precautions to return to the ED for any worsening  or new symptoms.     ____________________________________________  FINAL CLINICAL IMPRESSION(S) / ED DIAGNOSES  Final diagnoses:  Closed nondisplaced fracture of head of left radius, initial encounter  Other closed fracture of proximal end of left ulna, initial encounter      NEW MEDICATIONS STARTED DURING THIS VISIT:  Discharge Medication List as of 12/08/2016  8:57 PM  This chart was dictated using voice recognition software/Dragon. Despite best efforts to proofread, errors can occur which can change the meaning. Any change was purely unintentional.    Laban Emperor, PA-C 12/08/16 [id number]    Daymon Larsen, MD 12/08/16 [phone]

## 2016-12-08 NOTE — ED Triage Notes (Signed)
Pt presents to ED via POV. Pt states got her foot caught in a phone cord and tripped. Pt c/o L arm at this time. Pt is alert and oriented, states hit her nose, denies LOC at this time.

## 2016-12-13 ENCOUNTER — Encounter (HOSPITAL_BASED_OUTPATIENT_CLINIC_OR_DEPARTMENT_OTHER): Payer: Self-pay | Admitting: Anesthesiology

## 2016-12-13 ENCOUNTER — Ambulatory Visit (HOSPITAL_BASED_OUTPATIENT_CLINIC_OR_DEPARTMENT_OTHER)
Admission: RE | Admit: 2016-12-13 | Discharge: 2016-12-13 | Disposition: A | Discharge status: Home / Self Care | Payer: BLUE CROSS/BLUE SHIELD | Source: Ambulatory Visit | Attending: Orthopedic Surgery | Admitting: Orthopedic Surgery

## 2016-12-13 ENCOUNTER — Encounter (HOSPITAL_BASED_OUTPATIENT_CLINIC_OR_DEPARTMENT_OTHER)
Admission: RE | Disposition: A | Discharge status: Home / Self Care | Payer: Self-pay | Source: Ambulatory Visit | Attending: Orthopedic Surgery

## 2016-12-13 ENCOUNTER — Ambulatory Visit (HOSPITAL_BASED_OUTPATIENT_CLINIC_OR_DEPARTMENT_OTHER): Payer: BLUE CROSS/BLUE SHIELD | Admitting: Anesthesiology

## 2016-12-13 DIAGNOSIS — Z91018 Allergy to other foods: Secondary | ICD-10-CM | POA: Insufficient documentation

## 2016-12-13 DIAGNOSIS — Z8261 Family history of arthritis: Secondary | ICD-10-CM | POA: Diagnosis not present

## 2016-12-13 DIAGNOSIS — Z801 Family history of malignant neoplasm of trachea, bronchus and lung: Secondary | ICD-10-CM | POA: Diagnosis not present

## 2016-12-13 DIAGNOSIS — K449 Diaphragmatic hernia without obstruction or gangrene: Secondary | ICD-10-CM | POA: Diagnosis not present

## 2016-12-13 DIAGNOSIS — Z7982 Long term (current) use of aspirin: Secondary | ICD-10-CM | POA: Diagnosis not present

## 2016-12-13 DIAGNOSIS — Z79899 Other long term (current) drug therapy: Secondary | ICD-10-CM | POA: Insufficient documentation

## 2016-12-13 DIAGNOSIS — Z8249 Family history of ischemic heart disease and other diseases of the circulatory system: Secondary | ICD-10-CM | POA: Diagnosis not present

## 2016-12-13 DIAGNOSIS — Z800 Family history of malignant neoplasm of digestive organs: Secondary | ICD-10-CM | POA: Insufficient documentation

## 2016-12-13 DIAGNOSIS — K219 Gastro-esophageal reflux disease without esophagitis: Secondary | ICD-10-CM | POA: Insufficient documentation

## 2016-12-13 DIAGNOSIS — M1990 Unspecified osteoarthritis, unspecified site: Secondary | ICD-10-CM | POA: Diagnosis not present

## 2016-12-13 DIAGNOSIS — F419 Anxiety disorder, unspecified: Secondary | ICD-10-CM | POA: Insufficient documentation

## 2016-12-13 DIAGNOSIS — E669 Obesity, unspecified: Secondary | ICD-10-CM | POA: Insufficient documentation

## 2016-12-13 DIAGNOSIS — Z90710 Acquired absence of both cervix and uterus: Secondary | ICD-10-CM | POA: Insufficient documentation

## 2016-12-13 DIAGNOSIS — M1711 Unilateral primary osteoarthritis, right knee: Secondary | ICD-10-CM | POA: Diagnosis not present

## 2016-12-13 DIAGNOSIS — R002 Palpitations: Secondary | ICD-10-CM | POA: Diagnosis not present

## 2016-12-13 DIAGNOSIS — S52002A Unspecified fracture of upper end of left ulna, initial encounter for closed fracture: Secondary | ICD-10-CM | POA: Insufficient documentation

## 2016-12-13 DIAGNOSIS — I10 Essential (primary) hypertension: Secondary | ICD-10-CM | POA: Insufficient documentation

## 2016-12-13 DIAGNOSIS — G4730 Sleep apnea, unspecified: Secondary | ICD-10-CM | POA: Insufficient documentation

## 2016-12-13 DIAGNOSIS — Z885 Allergy status to narcotic agent status: Secondary | ICD-10-CM | POA: Diagnosis not present

## 2016-12-13 DIAGNOSIS — Y939 Activity, unspecified: Secondary | ICD-10-CM | POA: Insufficient documentation

## 2016-12-13 DIAGNOSIS — X58XXXA Exposure to other specified factors, initial encounter: Secondary | ICD-10-CM | POA: Insufficient documentation

## 2016-12-13 DIAGNOSIS — Z9049 Acquired absence of other specified parts of digestive tract: Secondary | ICD-10-CM | POA: Insufficient documentation

## 2016-12-13 DIAGNOSIS — S52022A Displaced fracture of olecranon process without intraarticular extension of left ulna, initial encounter for closed fracture: Secondary | ICD-10-CM | POA: Diagnosis present

## 2016-12-13 DIAGNOSIS — Z833 Family history of diabetes mellitus: Secondary | ICD-10-CM | POA: Insufficient documentation

## 2016-12-13 DIAGNOSIS — Z825 Family history of asthma and other chronic lower respiratory diseases: Secondary | ICD-10-CM | POA: Insufficient documentation

## 2016-12-13 DIAGNOSIS — S52122A Displaced fracture of head of left radius, initial encounter for closed fracture: Secondary | ICD-10-CM | POA: Diagnosis present

## 2016-12-13 HISTORY — DX: Displaced fracture of olecranon process without intraarticular extension of left ulna, initial encounter for closed fracture: S52.022A

## 2016-12-13 HISTORY — DX: Displaced fracture of head of left radius, initial encounter for closed fracture: S52.122A

## 2016-12-13 HISTORY — PX: ORIF ELBOW FRACTURE: SHX5031

## 2016-12-13 LAB — POCT I-STAT, CHEM 8
BUN: 14 mg/dL (ref 6–20)
CREATININE: 0.7 mg/dL (ref 0.44–1.00)
Calcium, Ion: 1.21 mmol/L (ref 1.15–1.40)
Chloride: 101 mmol/L (ref 101–111)
Glucose, Bld: 165 mg/dL — ABNORMAL HIGH (ref 65–99)
HCT: 44 % (ref 36.0–46.0)
HEMOGLOBIN: 15 g/dL (ref 12.0–15.0)
Potassium: 3.5 mmol/L (ref 3.5–5.1)
SODIUM: 142 mmol/L (ref 135–145)
TCO2: 27 mmol/L (ref 0–100)

## 2016-12-13 SURGERY — OPEN REDUCTION INTERNAL FIXATION (ORIF) ELBOW/OLECRANON FRACTURE
Anesthesia: General | Site: Elbow | Laterality: Left

## 2016-12-13 MED ORDER — BUPIVACAINE-EPINEPHRINE (PF) 0.5% -1:200000 IJ SOLN
INTRAMUSCULAR | Status: DC | PRN
  Administered 2016-12-13: 30 mL via PERINEURAL

## 2016-12-13 MED ORDER — OXYCODONE-ACETAMINOPHEN 5-325 MG PO TABS: 1.0000 | ORAL_TABLET | Freq: Four times a day (QID) | ORAL | 0 refills | Status: DC | PRN

## 2016-12-13 MED ORDER — FENTANYL CITRATE (PF) 100 MCG/2ML IJ SOLN
50.0000 ug | INTRAMUSCULAR | Status: AC | PRN
  Administered 2016-12-13: 50 ug via INTRAVENOUS
  Administered 2016-12-13 (×2): 25 ug via INTRAVENOUS

## 2016-12-13 MED ORDER — BACLOFEN 10 MG PO TABS: 10.0000 mg | ORAL_TABLET | Freq: Three times a day (TID) | ORAL | 0 refills | Status: DC

## 2016-12-13 MED ORDER — FENTANYL CITRATE (PF) 100 MCG/2ML IJ SOLN: 25.0000 ug | INTRAMUSCULAR | Status: DC | PRN

## 2016-12-13 MED ORDER — MIDAZOLAM HCL 2 MG/2ML IJ SOLN
INTRAMUSCULAR | Status: AC
  Filled 2016-12-13: qty 2

## 2016-12-13 MED ORDER — MIDAZOLAM HCL 2 MG/2ML IJ SOLN
1.0000 mg | INTRAMUSCULAR | Status: DC | PRN
Start: 2016-12-13 — End: 2016-12-13
  Administered 2016-12-13: 2 mg via INTRAVENOUS

## 2016-12-13 MED ORDER — FENTANYL CITRATE (PF) 100 MCG/2ML IJ SOLN
INTRAMUSCULAR | Status: AC
  Filled 2016-12-13: qty 2

## 2016-12-13 MED ORDER — ONDANSETRON HCL 4 MG/2ML IJ SOLN
INTRAMUSCULAR | Status: DC | PRN
  Administered 2016-12-13: 4 mg via INTRAVENOUS

## 2016-12-13 MED ORDER — SUCCINYLCHOLINE CHLORIDE 200 MG/10ML IV SOSY
PREFILLED_SYRINGE | INTRAVENOUS | Status: AC
  Filled 2016-12-13: qty 10

## 2016-12-13 MED ORDER — LIDOCAINE 2% (20 MG/ML) 5 ML SYRINGE
INTRAMUSCULAR | Status: AC
  Filled 2016-12-13: qty 5

## 2016-12-13 MED ORDER — SENNA-DOCUSATE SODIUM 8.6-50 MG PO TABS: 2.0000 | ORAL_TABLET | Freq: Every day | ORAL | 1 refills | Status: DC

## 2016-12-13 MED ORDER — CEFAZOLIN IN D5W 1 GM/50ML IV SOLN
INTRAVENOUS | Status: AC
  Filled 2016-12-13: qty 50

## 2016-12-13 MED ORDER — MEPERIDINE HCL 25 MG/ML IJ SOLN: 6.2500 mg | INTRAMUSCULAR | Status: DC | PRN

## 2016-12-13 MED ORDER — PROPOFOL 10 MG/ML IV BOLUS
INTRAVENOUS | Status: AC
  Filled 2016-12-13: qty 20

## 2016-12-13 MED ORDER — CEFAZOLIN SODIUM-DEXTROSE 2-4 GM/100ML-% IV SOLN
INTRAVENOUS | Status: AC
  Filled 2016-12-13: qty 100

## 2016-12-13 MED ORDER — LIDOCAINE HCL (CARDIAC) 20 MG/ML IV SOLN
INTRAVENOUS | Status: DC | PRN
  Administered 2016-12-13: 50 mg via INTRAVENOUS

## 2016-12-13 MED ORDER — DEXAMETHASONE SODIUM PHOSPHATE 4 MG/ML IJ SOLN
INTRAMUSCULAR | Status: DC | PRN
  Administered 2016-12-13: 10 mg via INTRAVENOUS

## 2016-12-13 MED ORDER — PROPOFOL 10 MG/ML IV BOLUS
INTRAVENOUS | Status: DC | PRN
Start: 2016-12-13 — End: 2016-12-13
  Administered 2016-12-13: 50 mg via INTRAVENOUS
  Administered 2016-12-13: 200 mg via INTRAVENOUS

## 2016-12-13 MED ORDER — 0.9 % SODIUM CHLORIDE (POUR BTL) OPTIME
TOPICAL | Status: DC | PRN
  Administered 2016-12-13: 200 mL

## 2016-12-13 MED ORDER — ONDANSETRON HCL 4 MG PO TABS: 4.0000 mg | ORAL_TABLET | Freq: Three times a day (TID) | ORAL | 0 refills | Status: DC | PRN

## 2016-12-13 MED ORDER — CEFAZOLIN SODIUM 10 G IJ SOLR
3.0000 g | INTRAMUSCULAR | Status: AC
  Administered 2016-12-13: 3 g via INTRAVENOUS

## 2016-12-13 MED ORDER — EPHEDRINE 5 MG/ML INJ
INTRAVENOUS | Status: AC
Start: 2016-12-13 — End: 2016-12-13
  Filled 2016-12-13: qty 10

## 2016-12-13 MED ORDER — PROMETHAZINE HCL 25 MG/ML IJ SOLN
6.2500 mg | INTRAMUSCULAR | Status: DC | PRN
Start: 2016-12-13 — End: 2016-12-13

## 2016-12-13 MED ORDER — ONDANSETRON HCL 4 MG/2ML IJ SOLN
INTRAMUSCULAR | Status: AC
  Filled 2016-12-13: qty 2

## 2016-12-13 MED ORDER — PHENYLEPHRINE 40 MCG/ML (10ML) SYRINGE FOR IV PUSH (FOR BLOOD PRESSURE SUPPORT)
PREFILLED_SYRINGE | INTRAVENOUS | Status: AC
  Filled 2016-12-13: qty 10

## 2016-12-13 MED ORDER — LACTATED RINGERS IV SOLN
INTRAVENOUS | Status: DC
  Administered 2016-12-13 (×2): via INTRAVENOUS

## 2016-12-13 MED ORDER — EPHEDRINE SULFATE 50 MG/ML IJ SOLN
INTRAMUSCULAR | Status: DC | PRN
  Administered 2016-12-13 (×2): 20 mg via INTRAVENOUS

## 2016-12-13 MED ORDER — LACTATED RINGERS IV SOLN: INTRAVENOUS | Status: DC

## 2016-12-13 SURGICAL SUPPLY — 93 items
BANDAGE ACE 3X5.8 VEL STRL LF (GAUZE/BANDAGES/DRESSINGS) ×4 IMPLANT
BANDAGE ACE 4X5 VEL STRL LF (GAUZE/BANDAGES/DRESSINGS) ×4 IMPLANT
BIT DRILL 1.8 CANN MAX VPC (BIT) ×4 IMPLANT
BIT DRILL 2.5X2.75 QC CALB (BIT) ×4 IMPLANT
BLADE AVERAGE 25MMX9MM (BLADE)
BLADE AVERAGE 25X9 (BLADE) IMPLANT
BLADE MINI RND TIP GREEN BEAV (BLADE) IMPLANT
BLADE SURG 15 STRL LF DISP TIS (BLADE) ×4 IMPLANT
BLADE SURG 15 STRL SS (BLADE) ×8
BNDG CMPR 9X4 STRL LF SNTH (GAUZE/BANDAGES/DRESSINGS) ×2
BNDG ESMARK 4X9 LF (GAUZE/BANDAGES/DRESSINGS) ×4 IMPLANT
CANISTER SUCT 1200ML W/VALVE (MISCELLANEOUS) ×4 IMPLANT
CLOSURE STERI-STRIP 1/2X4 (GAUZE/BANDAGES/DRESSINGS) ×2
CLSR STERI-STRIP ANTIMIC 1/2X4 (GAUZE/BANDAGES/DRESSINGS) ×6 IMPLANT
COVER BACK TABLE 60X90IN (DRAPES) ×4 IMPLANT
CUFF TOURNIQUET SINGLE 18IN (TOURNIQUET CUFF) IMPLANT
CUFF TOURNIQUET SINGLE 24IN (TOURNIQUET CUFF) ×4 IMPLANT
DECANTER SPIKE VIAL GLASS SM (MISCELLANEOUS) IMPLANT
DRAPE EXTREMITY T 121X128X90 (DRAPE) ×4 IMPLANT
DRAPE IMP U-DRAPE 54X76 (DRAPES) ×4 IMPLANT
DRAPE OEC MINIVIEW 54X84 (DRAPES) ×4 IMPLANT
DRAPE U-SHAPE 47X51 STRL (DRAPES) ×8 IMPLANT
DRSG PAD ABDOMINAL 8X10 ST (GAUZE/BANDAGES/DRESSINGS) ×4 IMPLANT
DURAPREP 26ML APPLICATOR (WOUND CARE) ×4 IMPLANT
ELECT REM PT RETURN 9FT ADLT (ELECTROSURGICAL) ×4
ELECTRODE REM PT RTRN 9FT ADLT (ELECTROSURGICAL) ×2 IMPLANT
GAUZE SPONGE 4X4 12PLY STRL (GAUZE/BANDAGES/DRESSINGS) ×4 IMPLANT
GLOVE BIOGEL PI IND STRL 7.0 (GLOVE) ×6 IMPLANT
GLOVE BIOGEL PI IND STRL 7.5 (GLOVE) ×4 IMPLANT
GLOVE BIOGEL PI IND STRL 8 (GLOVE) ×4 IMPLANT
GLOVE BIOGEL PI INDICATOR 7.0 (GLOVE) ×6
GLOVE BIOGEL PI INDICATOR 7.5 (GLOVE) ×4
GLOVE BIOGEL PI INDICATOR 8 (GLOVE) ×4
GLOVE ECLIPSE 6.5 STRL STRAW (GLOVE) ×16 IMPLANT
GLOVE ORTHO TXT STRL SZ7.5 (GLOVE) ×4 IMPLANT
GLOVE SURG ORTHO 8.0 STRL STRW (GLOVE) ×4 IMPLANT
GLOVE SURG SS PI 7.0 STRL IVOR (GLOVE) ×12 IMPLANT
GLOVE SURG SS PI 7.5 STRL IVOR (GLOVE) ×4 IMPLANT
GOWN STRL REUS W/ TWL LRG LVL3 (GOWN DISPOSABLE) ×6 IMPLANT
GOWN STRL REUS W/ TWL XL LVL3 (GOWN DISPOSABLE) ×6 IMPLANT
GOWN STRL REUS W/TWL LRG LVL3 (GOWN DISPOSABLE) ×12
GOWN STRL REUS W/TWL XL LVL3 (GOWN DISPOSABLE) ×12
K-WIRE COCR 0.9X95 (WIRE) ×8
K-WIRE FIXATION 2.0X6 (WIRE)
KWIRE COCR 0.9X95 (WIRE) ×4 IMPLANT
KWIRE FIXATION 2.0X6 (WIRE) IMPLANT
NEEDLE HYPO 25X1 1.5 SAFETY (NEEDLE) IMPLANT
NS IRRIG 1000ML POUR BTL (IV SOLUTION) ×4 IMPLANT
PACK BASIN DAY SURGERY FS (CUSTOM PROCEDURE TRAY) ×4 IMPLANT
PAD CAST 3X4 CTTN HI CHSV (CAST SUPPLIES) ×2 IMPLANT
PAD CAST 4YDX4 CTTN HI CHSV (CAST SUPPLIES) ×2 IMPLANT
PADDING CAST ABS 4INX4YD NS (CAST SUPPLIES) ×2
PADDING CAST ABS COTTON 4X4 ST (CAST SUPPLIES) ×2 IMPLANT
PADDING CAST COTTON 3X4 STRL (CAST SUPPLIES) ×4
PADDING CAST COTTON 4X4 STRL (CAST SUPPLIES) ×4
PENCIL BUTTON HOLSTER BLD 10FT (ELECTRODE) ×4 IMPLANT
PLATE OLECRANON LRG (Plate) ×4 IMPLANT
SCREW COMP HEADLESS 2.5X18 (Screw) ×8 IMPLANT
SCREW CORT 3.5X26 (Screw) ×4 IMPLANT
SCREW CORT T15 26X3.5XST LCK (Screw) ×2 IMPLANT
SCREW LOCK CORT STAR 3.5X12 (Screw) ×4 IMPLANT
SCREW LOCK CORT STAR 3.5X14 (Screw) ×4 IMPLANT
SCREW LOCK CORT STAR 3.5X18 (Screw) ×4 IMPLANT
SCREW LOCK CORT STAR 3.5X20 (Screw) ×8 IMPLANT
SCREW NON LOCKING LP 3.5 14MM (Screw) ×4 IMPLANT
SCREW NON LOCKING LP 3.5 16MM (Screw) ×4 IMPLANT
SLEEVE SCD COMPRESS KNEE MED (MISCELLANEOUS) ×4 IMPLANT
SLING ARM FOAM STRAP LRG (SOFTGOODS) ×4 IMPLANT
SPLINT FAST PLASTER 5X30 (CAST SUPPLIES) ×20
SPLINT PLASTER CAST FAST 5X30 (CAST SUPPLIES) ×20 IMPLANT
SPLINT PLASTER CAST XFAST 4X15 (CAST SUPPLIES) IMPLANT
SPLINT PLASTER XTRA FAST SET 4 (CAST SUPPLIES)
SPONGE LAP 18X18 X RAY DECT (DISPOSABLE) ×4 IMPLANT
SPONGE LAP 4X18 X RAY DECT (DISPOSABLE) ×4 IMPLANT
STOCKINETTE 4X48 STRL (DRAPES) ×4 IMPLANT
SUCTION FRAZIER HANDLE 10FR (MISCELLANEOUS) ×2
SUCTION TUBE FRAZIER 10FR DISP (MISCELLANEOUS) ×2 IMPLANT
SUT ETHIBOND 0 MO6 C/R (SUTURE) IMPLANT
SUT ETHILON 3 0 PS 1 (SUTURE) IMPLANT
SUT ETHILON 4 0 PS 2 18 (SUTURE) IMPLANT
SUT MNCRL AB 4-0 PS2 18 (SUTURE) IMPLANT
SUT VIC AB 0 CT1 27 (SUTURE) ×8
SUT VIC AB 0 CT1 27XBRD ANBCTR (SUTURE) ×4 IMPLANT
SUT VIC AB 3-0 SH 27 (SUTURE)
SUT VIC AB 3-0 SH 27X BRD (SUTURE) IMPLANT
SUT VICRYL 3-0 CR8 SH (SUTURE) ×4 IMPLANT
SYR BULB 3OZ (MISCELLANEOUS) ×4 IMPLANT
SYR CONTROL 10ML LL (SYRINGE) IMPLANT
TOWEL OR 17X24 6PK STRL BLUE (TOWEL DISPOSABLE) ×8 IMPLANT
TUBE CONNECTING 20'X1/4 (TUBING) ×1
TUBE CONNECTING 20X1/4 (TUBING) ×3 IMPLANT
UNDERPAD 30X30 (UNDERPADS AND DIAPERS) ×4 IMPLANT
WASHER 3.5MM (Orthopedic Implant) ×4 IMPLANT

## 2016-12-13 NOTE — Anesthesia Procedure Notes (Signed)
Anesthesia Regional Block:  Supraclavicular block  Pre-Anesthetic Checklist: ,, timeout performed, Correct Patient, Correct Site, Correct Laterality, Correct Procedure, Correct Position, site marked, Risks and benefits discussed,  Surgical consent,  Pre-op evaluation,  At surgeon's request and post-op pain management  Laterality: Left  Prep: chloraprep       Needles:  Injection technique: Single-shot  Needle Type: Echogenic Needle     Needle Length: 9cm 9 cm Needle Gauge: 21 and 21 G    Additional Needles:  Procedures: ultrasound guided (picture in chart) Supraclavicular block Narrative:  Start time: 12/13/2016 11:05 AM End time: 12/13/2016 11:10 AM Injection made incrementally with aspirations every 5 mL.  Performed by: Personally  Anesthesiologist: Suella Broad D  Additional Notes: Pt tolerated well.

## 2016-12-13 NOTE — H&P (Signed)
PREOPERATIVE H&P  Chief Complaint: Left elbow pain  HPI: [name] is a 61 y.o. female who presents for preoperative history and physical with a diagnosis of left radius and ulna fracture, proximal. Symptoms are rated as moderate to severe, and have been worsening.  This is significantly impairing activities of daily living.  She has elected for surgical management. There was significant comminution and displacement of the radial head, the ulna has minimal displacement.   Past Medical History:  Diagnosis Date  . Allergy   . Anxiety   . Arthritis   . Family history of anesthesia complication    PARENTS HAD NAUSEA  . GERD (gastroesophageal reflux disease)    prilosec once/week  . H/O hiatal hernia   . Hypertension   . Obesity   . Osteoarthritis of right knee 08/05/2014  . Palpitations   . Sleep apnea    cpap   Past Surgical History:  Procedure Laterality Date  . ABDOMINAL HYSTERECTOMY    . BREAST BIOPSY Right    neg/stereo  . BREAST EXCISIONAL BIOPSY Right 2003   neg  . BREAST SURGERY     breast biopsy - benign  . CHOLECYSTECTOMY    . COLONOSCOPY  multiple  . PARTIAL KNEE ARTHROPLASTY Right 08/05/2014   DR Shataya Winkles  . PARTIAL KNEE ARTHROPLASTY Right 08/05/2014   Procedure: RIGHT UNICOMPARTMENTAL KNEE;  Surgeon: Johnny Bridge, MD;  Location: [facility];  Service: Orthopedics;  Laterality: Right;  . TUBAL LIGATION    . UPPER GASTROINTESTINAL ENDOSCOPY  10/2005   hiatal hernia, reflux  . WRIST FRACTURE SURGERY  04/14/11   left Dr. Fredna Dow   Social History   Social History  . Marital status: Married    Spouse name: N/A  . Number of children: 2  . Years of education: N/A   Occupational History  . CMA, [employer], [employer]   Social History Main Topics  . Smoking status: Never Smoker  . Smokeless tobacco: Never Used  . Alcohol use No  . Drug use: No  . Sexual activity: Not Asked   Other Topics Concern  . None   Social History Narrative    Regular Exercise:  Yes, elliptical   Diet:  Mostly fruits and veggies, some meat.   Enjoys:  Firefighter, crocheting, sewing, playing with grandchildren.   Family History  Problem Relation Age of Onset  . Diabetes Mother   . Hypertension Mother   . Hyperlipidemia Mother   . Heart disease Mother     Angioplasty  . Arthritis Father   . Heart disease Father     CABG  . Hypertension Father   . Hyperlipidemia Father   . Asthma Daughter   . Cancer Paternal Aunt     Colon  . Rectal cancer Paternal Aunt   . Cancer Paternal Uncle     Lung, smoker   Allergies  Allergen Reactions  . Wheat Rash  . Codeine Itching   Prior to Admission medications   Medication Sig Start Date End Date Taking? Authorizing Provider  amLODipine (NORVASC) 5 MG tablet Take 5 mg by mouth daily.   Yes Historical Provider, MD  aspirin 81 MG tablet Take 81 mg by mouth daily.    Yes Historical Provider, MD  atenolol (TENORMIN) 100 MG tablet Take 100 mg by mouth daily.     Yes Historical Provider, MD  b complex vitamins capsule Take 1 capsule by mouth daily.   Yes Historical Provider, MD  Cholecalciferol (VITAMIN D3) 5000  UNITS CAPS Take by mouth daily.     Yes Historical Provider, MD  citalopram (CELEXA) 20 MG tablet Take one and one half tablets (30 mg) daily.   Yes Historical Provider, MD  fluticasone (FLONASE) 50 MCG/ACT nasal spray Place 1 spray into both nostrils daily as needed for allergies or rhinitis.   Yes Historical Provider, MD  furosemide (LASIX) 20 MG tablet Take 1 tablet by mouth. Twice a week 12/22/11  Yes Historical Provider, MD  lisinopril-hydrochlorothiazide (PRINZIDE,ZESTORETIC) 20-12.5 MG per tablet Take 2 tablets by mouth daily.   Yes Historical Provider, MD  loratadine (CLARITIN) 10 MG tablet Take by mouth.   Yes Historical Provider, MD  magnesium oxide (MAG-OX) 400 MG tablet Take 400 mg by mouth daily.   Yes Historical Provider, MD  VITAMIN A PO Take 1 capsule by mouth daily.   Yes Historical  Provider, MD  vitamin C (ASCORBIC ACID) 500 MG tablet Take 500 mg by mouth daily.   Yes Historical Provider, MD  lidocaine (LIDODERM) 5 % Place 1 patch onto the skin every 12 (twelve) hours. Remove & Discard patch within 12 hours or as directed by MD 12/08/16 12/08/17  Laban Emperor, PA-C  omeprazole (PRILOSEC) 20 MG capsule Take by mouth.    Historical Provider, MD  terbinafine (LAMISIL) 250 MG tablet Take 1 tablet (250 mg total) by mouth daily. 10/19/16   Max T Hyatt, DPM     Positive ROS: All other systems have been reviewed and were otherwise negative with the exception of those mentioned in the HPI and as above.  Physical Exam: General: Alert, no acute distress Cardiovascular: No pedal edema Respiratory: No cyanosis, no use of accessory musculature GI: No organomegaly, abdomen is soft and non-tender Skin: No lesions in the area of chief complaint Neurologic: Sensation intact distally Psychiatric: Patient is competent for consent with normal mood and affect Lymphatic: No axillary or cervical lymphadenopathy  MUSCULOSKELETAL: Left arm has sensation intact throughout, positive pain to palpation proximally around the elbow, all fingers flex extend and abduct.  Assessment: Left ulnar fracture and radial head fracture   Plan: Plan for Procedure(s): Left ulna and radial head open reduction internal fixation,, possible radial head arthroplasty  The risks benefits and alternatives were discussed with the patient including but not limited to the risks of nonoperative treatment, versus surgical intervention including infection, bleeding, nerve injury, malunion, nonunion, the need for revision surgery, hardware prominence, hardware failure, the need for hardware removal, blood clots, cardiopulmonary complications, morbidity, mortality, among others, and they were willing to proceed.     Johnny Bridge, MD Cell [phone]   12/13/2016 11:25 AM

## 2016-12-13 NOTE — Discharge Instructions (Signed)
Diet: As you were doing prior to hospitalization   Shower:  May shower but keep the wounds dry, use an occlusive plastic wrap, NO SOAKING IN TUB.  If the bandage gets wet, change with a clean dry gauze.  If you have a splint on, leave the splint in place and keep the splint dry with a plastic bag.  Dressing:  You may change your dressing 3-5 days after surgery, unless you have a splint.  If you have a splint, then just leave the splint in place and we will change your bandages during your first follow-up appointment.    If you had hand or foot surgery, we will plan to remove your stitches in about 2 weeks in the office.  For all other surgeries, there are sticky tapes (steri-strips) on your wounds and all the stitches are absorbable.  Leave the steri-strips in place when changing your dressings, they will peel off with time, usually 2-3 weeks.  Activity:  Increase activity slowly as tolerated, but follow the weight bearing instructions below.  The rules on driving is that you can not be taking narcotics while you drive, and you must feel in control of the vehicle.    Weight Bearing:   No lifting with left arm..    To prevent constipation: you may use a stool softener such as -  Colace (over the counter) 100 mg by mouth twice a day  Drink plenty of fluids (prune juice may be helpful) and high fiber foods Miralax (over the counter) for constipation as needed.    Itching:  If you experience itching with your medications, try taking only a single pain pill, or even half a pain pill at a time.  You may take up to 10 pain pills per day, and you can also use benadryl over the counter for itching or also to help with sleep.   Precautions:  If you experience chest pain or shortness of breath - call 911 immediately for transfer to the hospital emergency department!!  If you develop a fever greater that 101 F, purulent drainage from wound, increased redness or drainage from wound, or calf pain -- Call the  office at [phone]                                                Follow- Up Appointment:  Please call for an appointment to be seen in 2 weeks [place] - [phone]   Call your surgeon if you experience:   1.  Fever over 101.0. 2.  Inability to urinate. 3.  Nausea and/or vomiting. 4.  Extreme swelling or bruising at the surgical site. 5.  Continued bleeding from the incision. 6.  Increased pain, redness or drainage from the incision. 7.  Problems related to your pain medication. 8.  Any problems and/or concerns   Post Anesthesia Home Care Instructions  Activity: Get plenty of rest for the remainder of the day. A responsible adult should stay with you for 24 hours following the procedure.  For the next 24 hours, DO NOT: -Drive a car -Paediatric nurse -Drink alcoholic beverages -Take any medication unless instructed by your physician -Make any legal decisions or sign important papers.  Meals: Start with liquid foods such as gelatin or soup. Progress to regular foods as tolerated. Avoid greasy, spicy, heavy foods. If nausea and/or vomiting occur, drink only clear  liquids until the nausea and/or vomiting subsides. Call your physician if vomiting continues.  Special Instructions/Symptoms: Your throat may feel dry or sore from the anesthesia or the breathing tube placed in your throat during surgery. If this causes discomfort, gargle with warm salt water. The discomfort should disappear within 24 hours.  If you had a scopolamine patch placed behind your ear for the management of post- operative nausea and/or vomiting:  1. The medication in the patch is effective for 72 hours, after which it should be removed.  Wrap patch in a tissue and discard in the trash. Wash hands thoroughly with soap and water. 2. You may remove the patch earlier than 72 hours if you experience unpleasant side effects which may include dry mouth, dizziness or visual disturbances. 3. Avoid touching the  patch. Wash your hands with soap and water after contact with the patch.

## 2016-12-13 NOTE — Anesthesia Procedure Notes (Signed)
Procedure Name: LMA Insertion Date/Time: 12/13/2016 12:04 PM Performed by: Melynda Ripple D Pre-anesthesia Checklist: Patient identified, Emergency Drugs available, Suction available and Patient being monitored Patient Re-evaluated:Patient Re-evaluated prior to inductionOxygen Delivery Method: Circle system utilized Preoxygenation: Pre-oxygenation with 100% oxygen Intubation Type: IV induction Ventilation: Mask ventilation without difficulty LMA: LMA inserted LMA Size: 4.0 Number of attempts: 1 Airway Equipment and Method: Bite block Placement Confirmation: positive ETCO2 Tube secured with: Tape Dental Injury: Teeth and Oropharynx as per pre-operative assessment

## 2016-12-13 NOTE — Anesthesia Preprocedure Evaluation (Signed)

## 2016-12-13 NOTE — Op Note (Signed)
12/13/2016  2:30 PM  PATIENT:  [name]    PRE-OPERATIVE DIAGNOSIS:  Left proximal ulna and radial head fracture  POST-OPERATIVE DIAGNOSIS:  Same  PROCEDURE:  Open reduction internal fixation left proximal ulna fracture and radial head fracture  SURGEON:  Johnny Bridge, MD  PHYSICIAN ASSISTANT: Joya Gaskins, OPA-C, present and scrubbed throughout the case, critical for completion in a timely fashion, and for retraction, instrumentation, and closure.  ANESTHESIA:   General  PREOPERATIVE INDICATIONS:  [name] is a  61 y.o. female who had the equivalent of a "terrible triad" elbow fracture, without the dislocation and had displacement of the radial head and elected for surgical management.  The risks benefits and alternatives were discussed with the patient including but not limited to the risks of nonoperative treatment, versus surgical intervention including infection, bleeding, nerve injury, malunion, nonunion, the need for revision surgery, hardware prominence, hardware failure, the need for hardware removal, blood clots, cardiopulmonary complications, morbidity, mortality, among others, and they were willing to proceed.     OPERATIVE IMPLANTS: Biomet proximal olecranon plate, locking, with 4 proximal locking screws, 1 proximal nonlocking screw, with 3 distal bicortical screws, 1 was locking and 2 were nonlocking. I used a total of 2 2.5 mm cannulated headless compression screws for the radial head.  OPERATIVE FINDINGS: Comminution of the proximal ulna fracture with a triangular coronoid piece, the radial head was depressed, but in a single separate piece.  OPERATIVE PROCEDURE: The patient is brought to the operating room and placed in the supine position. Gen. anesthesia was administered. Time out performed. The left upper extremity was prepped and draped in usual sterile fashion. It was elevated and exsanguinated and the tourniquet was inflated. A posterior approach to  the ulna was carried out.  The triceps was preserved, the periosteum elevated, the fracture reduced anatomically, and I applied a precontoured proximal ulna plate after breaking off the proximal most tab, which is not necessary, and also the ulnar-sided tab. I secured the plate distally first, confirmed position with C-arm and with K wires, and then secured the plate proximally with a nonlocking screw. I had excellent fixation of the coronoid piece, and then after confirming position with C-arm, I completed the fixation with proximal locking screws, as well as a locking screw that was unicortical using the radial sided tab, and then distally I used a single locking screw and 2 nonlocking screws. The locking screw was in the second from most distal hole.  After complete fixation was achieved, final C-arm pictures of the ulna were taken, I irrigated the wounds copiously. I then repaired the fascia with 0 Vicryl followed by oh and 20 for the subcutaneous tissue and 30 as well.  I then through a separate incision made a approach to the radial head, and I had adequate skin bridge. Her BMI is over 50, and the arm was quite large, such that exposure through a single incision was not amenable.  Dissection was carried down, and then I performed a incision through the extensor musculature as well as the capsule staying anterior to the anterior aspect of the capitellum, in order to minimize risk for destabilization of the elbow.  The fracture was exposed, and reduced anatomically, and then I used guidewires to position the fragment, it was anatomically reduced, confirmed on C-arm, and I placed 2 compression screws that were countersunk.  Excellent fixation was achieved, there were no clicks or clunks, C-arm was used to confirm position of all the hardware  and there was nothing prominent.  I irrigated the wounds copiously, and repaired the capsule and fascia with Vicryl followed by subcutaneous Vicryl with routine  closure for the skin. Steri-Strips were applied. She had a preoperative block. She was awakened and placed in a splint and returned to PACU in stable and satisfactory condition. There were no complications and she tolerated the procedure well.

## 2016-12-13 NOTE — Anesthesia Preprocedure Evaluation (Addendum)
Anesthesia Evaluation  Patient identified by MRN, date of birth, ID band Patient awake    Reviewed: Allergy & Precautions, NPO status , Patient's Chart, lab work & pertinent test results, reviewed documented beta blocker date and time   Airway Mallampati: III  TM Distance: >3 FB Neck ROM: Full    Dental  (+) Teeth Intact, Dental Advisory Given   Pulmonary sleep apnea and Continuous Positive Airway Pressure Ventilation ,    breath sounds clear to auscultation       Cardiovascular hypertension, Pt. on medications and Pt. on home beta blockers  Rhythm:Regular Rate:Normal     Neuro/Psych PSYCHIATRIC DISORDERS Anxiety negative neurological ROS     GI/Hepatic Neg liver ROS, hiatal hernia, GERD  Medicated and Controlled,  Endo/Other  negative endocrine ROS  Renal/GU negative Renal ROS  negative genitourinary   Musculoskeletal  (+) Arthritis , Osteoarthritis,    Abdominal   Peds negative pediatric ROS (+)  Hematology negative hematology ROS (+)   Anesthesia Other Findings   Reproductive/Obstetrics negative OB ROS                             Anesthesia Physical Anesthesia Plan  ASA: III  Anesthesia Plan: General   Post-op Pain Management:  Regional for Post-op pain   Induction: Intravenous  Airway Management Planned: LMA  Additional Equipment:   Intra-op Plan:   Post-operative Plan: Extubation in OR  Informed Consent: I have reviewed the patients History and Physical, chart, labs and discussed the procedure including the risks, benefits and alternatives for the proposed anesthesia with the patient or authorized representative who has indicated his/her understanding and acceptance.   Dental advisory given  Plan Discussed with: CRNA  Anesthesia Plan Comments: (Pt consented for GA as well. Per patient, OSA well controlled with CPAP. )       Anesthesia Quick Evaluation

## 2016-12-13 NOTE — Progress Notes (Signed)
Assisted Dr. Hollis with left, ultrasound guided, supraclavicular block. Side rails up, monitors on throughout procedure. See vital signs in flow sheet. Tolerated Procedure well. 

## 2016-12-13 NOTE — Transfer of Care (Signed)
Immediate Anesthesia Transfer of Care Note  Patient: [name]  Procedure(s) Performed: Procedure(s): OPEN REDUCTION INTERNAL FIXATION (ORIF) OLECRANON AND PROXIMAL RADIUS (Left)  Patient Location: PACU  Anesthesia Type:GA combined with regional for post-op pain  Level of Consciousness: awake, sedated and patient cooperative  Airway & Oxygen Therapy: Patient Spontanous Breathing and Patient connected to face mask oxygen  Post-op Assessment: Report given to RN and Post -op Vital signs reviewed and stable  Post vital signs: Reviewed and stable  Last Vitals:  Vitals:   12/13/16 1115 12/13/16 1119  BP: 129/64   Pulse: 72 77  Resp: 17 (!) 21  Temp:      Last Pain:  Vitals:   12/13/16 1018  TempSrc: Oral  PainSc: 4       Patients Stated Pain Goal: 2 ([id number])  Complications: No apparent anesthesia complications

## 2016-12-14 ENCOUNTER — Encounter (HOSPITAL_BASED_OUTPATIENT_CLINIC_OR_DEPARTMENT_OTHER): Payer: Self-pay | Admitting: Orthopedic Surgery

## 2016-12-14 NOTE — Anesthesia Postprocedure Evaluation (Addendum)
Anesthesia Post Note  Patient: [name]  Procedure(s) Performed: Procedure(s) (LRB): OPEN REDUCTION INTERNAL FIXATION (ORIF) OLECRANON AND PROXIMAL RADIUS (Left)  Patient location during evaluation: PACU Anesthesia Type: General Level of consciousness: awake and alert Pain management: pain level controlled Vital Signs Assessment: post-procedure vital signs reviewed and stable Respiratory status: spontaneous breathing, nonlabored ventilation, respiratory function stable and patient connected to nasal cannula oxygen Cardiovascular status: blood pressure returned to baseline and stable Postop Assessment: no signs of nausea or vomiting Anesthetic complications: no        Last Vitals:  Vitals:   12/13/16 1545 12/13/16 1603  BP: (!) 152/74 140/68  Pulse: 86 85  Resp: 17 20  Temp:  37.1 C    Last Pain:  Vitals:   12/13/16 1530  TempSrc:   PainSc: 0-No pain   Pain Goal: Patients Stated Pain Goal: 2 (12/13/16 1018)               Effie Berkshire

## 2017-01-18 ENCOUNTER — Ambulatory Visit (INDEPENDENT_AMBULATORY_CARE_PROVIDER_SITE_OTHER): Payer: BLUE CROSS/BLUE SHIELD | Admitting: Podiatry

## 2017-01-18 ENCOUNTER — Encounter: Payer: Self-pay | Admitting: Podiatry

## 2017-01-18 DIAGNOSIS — L603 Nail dystrophy: Secondary | ICD-10-CM

## 2017-01-18 DIAGNOSIS — Z79899 Other long term (current) drug therapy: Secondary | ICD-10-CM

## 2017-01-18 MED ORDER — TERBINAFINE HCL 250 MG PO TABS: 250.0000 mg | ORAL_TABLET | Freq: Every day | ORAL | 0 refills | Status: DC

## 2017-01-18 NOTE — Progress Notes (Signed)
She presents today for follow-up of her Lamisil therapy she has completed 120 day regimen plus an additional 30 tablets over 60 days. She states that they are almost completely grown out with exception of this peritalar she reports the hallux left. She denies any changes in her past medical history medications allergy other than a broken elbow which she had open reduced. No new medications.  Objective: Vital signs are stable she is alert and oriented 3 pulses are palpable. Toenails appear to be nearly 100% resolved.  Assessment: Onychomycosis long-term therapy.  Plan: I'm going to continue her therapy every other day for the next 2 months. Dispense Lamisil 250 mg 1 by mouth every other day. Follow-up with me in 3 months

## 2017-04-19 ENCOUNTER — Ambulatory Visit: Payer: BLUE CROSS/BLUE SHIELD | Admitting: Podiatry

## 2017-06-02 NOTE — Addendum Note (Signed)
Addendum  created 06/02/17 0907 by Nolon Nations, MD   Sign clinical note

## 2017-06-02 NOTE — Anesthesia Postprocedure Evaluation (Signed)
Anesthesia Post Note  Patient: [name]  Procedure(s) Performed: Procedure(s) (LRB): OPEN REDUCTION INTERNAL FIXATION (ORIF) OLECRANON AND PROXIMAL RADIUS (Left)     Anesthesia Post Evaluation  Last Vitals:  Vitals:   12/13/16 1545 12/13/16 1603  BP: (!) 152/74 140/68  Pulse: 86 85  Resp: 17 20  Temp:  37.1 C    Last Pain:  Vitals:   12/13/16 1530  TempSrc:   PainSc: 0-No pain                 Nolon Nations

## 2017-08-17 ENCOUNTER — Other Ambulatory Visit: Payer: Self-pay | Admitting: Family Medicine

## 2017-08-17 DIAGNOSIS — Z1231 Encounter for screening mammogram for malignant neoplasm of breast: Secondary | ICD-10-CM

## 2017-08-30 ENCOUNTER — Ambulatory Visit
Admission: RE | Admit: 2017-08-30 | Discharge: 2017-08-30 | Disposition: A | Discharge status: Home / Self Care | Payer: BLUE CROSS/BLUE SHIELD | Source: Ambulatory Visit | Attending: Family Medicine | Admitting: Family Medicine

## 2017-08-30 DIAGNOSIS — Z1231 Encounter for screening mammogram for malignant neoplasm of breast: Secondary | ICD-10-CM | POA: Diagnosis present

## 2017-10-30 ENCOUNTER — Ambulatory Visit: Payer: BLUE CROSS/BLUE SHIELD | Admitting: Internal Medicine

## 2018-03-05 ENCOUNTER — Ambulatory Visit (INDEPENDENT_AMBULATORY_CARE_PROVIDER_SITE_OTHER): Payer: BLUE CROSS/BLUE SHIELD

## 2018-03-05 ENCOUNTER — Encounter: Payer: Self-pay | Admitting: Podiatry

## 2018-03-05 ENCOUNTER — Ambulatory Visit: Payer: BLUE CROSS/BLUE SHIELD | Admitting: Podiatry

## 2018-03-05 DIAGNOSIS — M722 Plantar fascial fibromatosis: Secondary | ICD-10-CM

## 2018-03-05 DIAGNOSIS — M779 Enthesopathy, unspecified: Secondary | ICD-10-CM | POA: Diagnosis not present

## 2018-03-05 MED ORDER — METHYLPREDNISOLONE 4 MG PO TBPK: ORAL_TABLET | ORAL | 0 refills | Status: DC

## 2018-03-05 MED ORDER — MELOXICAM 15 MG PO TABS: 15.0000 mg | ORAL_TABLET | Freq: Every day | ORAL | 3 refills | Status: DC

## 2018-03-05 NOTE — Patient Instructions (Signed)

## 2018-03-05 NOTE — Progress Notes (Signed)
She presents today after having not seen her for quite some time with a chief complaint of pain to the lateral ankle and the plantar medial aspect of her left foot.  She states that the pain is constant with intermittent sharp pains and it wakes her up at night sometimes but particularly standing and first thing in the morning is very painful.  She has been using ice and heat and meloxicam to no avail.  Objective: Vital signs are stable she is alert and oriented x3.  Neurologic sensorium is intact per Semmes Weinstein monofilament knee deep tendon reflexes are intact muscle strength is normal symmetrical bilateral.  Orthopedic evaluation demonstrates pain on palpation of the peroneal brevis tendon inferior to the lateral malleolus.  She also has pain on palpation to the medial band of the plantar fascia at its insertion site.  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  Plan: Discussed etiology pathology conservative versus surgical therapies.  At this point for her on a Medrol Dosepak to be followed by meloxicam injected her peroneal brevis tendon after sterile Betadine skin prep as well as the medial aspect of the left heel with 20 mg of Kenalog 5 mg Marcaine point maximal tenderness.  I will follow-up with her in a couple of weeks for repeat evaluation.

## 2018-04-11 ENCOUNTER — Encounter: Payer: Self-pay | Admitting: Podiatry

## 2018-04-11 ENCOUNTER — Ambulatory Visit: Payer: BLUE CROSS/BLUE SHIELD | Admitting: Podiatry

## 2018-04-11 DIAGNOSIS — M779 Enthesopathy, unspecified: Secondary | ICD-10-CM | POA: Diagnosis not present

## 2018-04-11 DIAGNOSIS — M722 Plantar fascial fibromatosis: Secondary | ICD-10-CM

## 2018-04-11 NOTE — Progress Notes (Signed)
She presents today for follow-up of her plantar fasciitis and peroneal tendinitis.  She states that he was doing much better a couple of weeks ago and is still doing very well compared to where it was.  She states that still has some pain and he tends to burn and radiate up this way as she points to the anterior lateral ankle.  Objective: Vital signs are stable she is alert and oriented x3.  Pulses are palpable.  No pain on palpation medial calcaneal tubercle of the left heel.  She still has tenderness on palpation of the [place] brevis tendon left just inferior to the lateral malleolus.  Some fluctuance is noted in this area.  Assessment: Peroneal tendinitis left resolving plantar fasciitis left.  Plan: After sterile Betadine skin prep I injected 2 mg of dexamethasone and local anesthetic to the point of maximal tenderness.  She tolerated this procedure well without complications.  At this point I am going to have her scanned for a set of orthotics.

## 2018-05-23 ENCOUNTER — Other Ambulatory Visit: Payer: BLUE CROSS/BLUE SHIELD | Admitting: Orthotics

## 2018-05-23 ENCOUNTER — Ambulatory Visit: Payer: BLUE CROSS/BLUE SHIELD | Admitting: Podiatry

## 2018-05-30 ENCOUNTER — Other Ambulatory Visit: Payer: BLUE CROSS/BLUE SHIELD | Admitting: Orthotics

## 2018-05-30 ENCOUNTER — Ambulatory Visit: Payer: BLUE CROSS/BLUE SHIELD | Admitting: Orthotics

## 2018-05-30 DIAGNOSIS — M779 Enthesopathy, unspecified: Secondary | ICD-10-CM

## 2018-05-30 DIAGNOSIS — M722 Plantar fascial fibromatosis: Secondary | ICD-10-CM

## 2018-05-30 NOTE — Progress Notes (Signed)
Patient came in today to pick up custom made foot orthotics.  The goals were accomplished and the patient reported no dissatisfaction with said orthotics.  Patient was advised of breakin period and how to report any issues. 

## 2018-06-20 ENCOUNTER — Other Ambulatory Visit: Payer: BLUE CROSS/BLUE SHIELD | Admitting: Orthotics

## 2018-06-20 ENCOUNTER — Ambulatory Visit: Payer: BLUE CROSS/BLUE SHIELD | Admitting: Podiatry

## 2018-08-08 DIAGNOSIS — J989 Respiratory disorder, unspecified: Secondary | ICD-10-CM | POA: Insufficient documentation

## 2018-08-09 ENCOUNTER — Other Ambulatory Visit: Payer: Self-pay | Admitting: Family Medicine

## 2018-08-09 DIAGNOSIS — Z1231 Encounter for screening mammogram for malignant neoplasm of breast: Secondary | ICD-10-CM

## 2018-09-05 ENCOUNTER — Ambulatory Visit
Admission: RE | Admit: 2018-09-05 | Discharge: 2018-09-05 | Disposition: A | Discharge status: Home / Self Care | Payer: BLUE CROSS/BLUE SHIELD | Source: Ambulatory Visit | Attending: Family Medicine | Admitting: Family Medicine

## 2018-09-05 DIAGNOSIS — Z1231 Encounter for screening mammogram for malignant neoplasm of breast: Secondary | ICD-10-CM | POA: Diagnosis not present

## 2018-10-31 ENCOUNTER — Encounter: Payer: Self-pay | Admitting: Podiatry

## 2018-10-31 ENCOUNTER — Ambulatory Visit: Payer: BLUE CROSS/BLUE SHIELD | Admitting: Podiatry

## 2018-10-31 DIAGNOSIS — L600 Ingrowing nail: Secondary | ICD-10-CM

## 2018-10-31 MED ORDER — DOXYCYCLINE HYCLATE 100 MG PO TABS: 100.0000 mg | ORAL_TABLET | Freq: Two times a day (BID) | ORAL | 0 refills | Status: DC

## 2018-10-31 NOTE — Progress Notes (Signed)
She presents today chief complaint of ingrown toenail hallux right tibial border times the past 2 to 3 weeks.  Objective: Vital signs are stable at reading x3.  Pulses palpable.  Regular rate and MRG and tibial border hallux right with erythema edema purulence and malodor.  Assessment: Ingrown nail paronychia abscess hallux right.  Plan: At this point a partial temporary nail avulsion was performed after local anesthesia was administered she tolerated procedure well without complications.  She is provided with both oral and written home monitors for the care and soaking of the toe and will follow up with me in 1 to 2 weeks.  Doxycycline was dispensed.

## 2018-10-31 NOTE — Patient Instructions (Signed)

## 2018-11-28 ENCOUNTER — Ambulatory Visit: Payer: BLUE CROSS/BLUE SHIELD | Admitting: Podiatry

## 2018-11-28 ENCOUNTER — Encounter: Payer: Self-pay | Admitting: Podiatry

## 2018-11-28 DIAGNOSIS — L03031 Cellulitis of right toe: Secondary | ICD-10-CM

## 2018-11-28 NOTE — Progress Notes (Signed)
She presents today for follow-up of matrixectomy hallux right date of procedure 10/31/2018.  Denies fever chills nausea body muscle aches and pains states is still little bit uncomfortable toward the proximal nail fold.  Objective: Vital signs stable alert oriented x3 there is minimal erythema minimal edema cellulitis drainage odor not seen.  Mild tenderness on palpation.  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  Plan: Encouraged her to utilize Bactroban ointment small amount after soaking every other day to the wound and cover to the day will leave open at bedtime.  If not improved in 3 weeks follow-up with me.

## 2019-01-30 DIAGNOSIS — E1142 Type 2 diabetes mellitus with diabetic polyneuropathy: Secondary | ICD-10-CM | POA: Insufficient documentation

## 2019-01-31 ENCOUNTER — Other Ambulatory Visit: Payer: Self-pay | Admitting: Family Medicine

## 2019-01-31 ENCOUNTER — Telehealth: Payer: Self-pay | Admitting: Radiology

## 2019-01-31 DIAGNOSIS — E1169 Type 2 diabetes mellitus with other specified complication: Secondary | ICD-10-CM

## 2019-01-31 DIAGNOSIS — R7401 Elevation of levels of liver transaminase levels: Secondary | ICD-10-CM

## 2019-01-31 DIAGNOSIS — E782 Mixed hyperlipidemia: Secondary | ICD-10-CM

## 2019-01-31 DIAGNOSIS — R740 Nonspecific elevation of levels of transaminase and lactic acid dehydrogenase [LDH]: Principal | ICD-10-CM

## 2019-02-06 ENCOUNTER — Telehealth: Payer: Self-pay | Admitting: Family Medicine

## 2019-02-13 ENCOUNTER — Ambulatory Visit: Payer: BLUE CROSS/BLUE SHIELD

## 2019-03-14 ENCOUNTER — Ambulatory Visit
Admission: RE | Admit: 2019-03-14 | Discharge: 2019-03-14 | Disposition: A | Discharge status: Home / Self Care | Payer: Medicare Other | Source: Ambulatory Visit | Attending: Family Medicine | Admitting: Family Medicine

## 2019-03-14 ENCOUNTER — Other Ambulatory Visit: Payer: Self-pay

## 2019-03-14 DIAGNOSIS — E782 Mixed hyperlipidemia: Secondary | ICD-10-CM | POA: Insufficient documentation

## 2019-03-14 DIAGNOSIS — E1169 Type 2 diabetes mellitus with other specified complication: Secondary | ICD-10-CM | POA: Diagnosis present

## 2019-03-14 DIAGNOSIS — R740 Nonspecific elevation of levels of transaminase and lactic acid dehydrogenase [LDH]: Secondary | ICD-10-CM | POA: Diagnosis not present

## 2019-03-14 DIAGNOSIS — R7401 Elevation of levels of liver transaminase levels: Secondary | ICD-10-CM

## 2019-06-05 DIAGNOSIS — G4733 Obstructive sleep apnea (adult) (pediatric): Secondary | ICD-10-CM | POA: Insufficient documentation

## 2019-12-18 ENCOUNTER — Other Ambulatory Visit: Payer: Self-pay | Admitting: Family Medicine

## 2019-12-18 DIAGNOSIS — Z1231 Encounter for screening mammogram for malignant neoplasm of breast: Secondary | ICD-10-CM

## 2020-01-24 ENCOUNTER — Ambulatory Visit (INDEPENDENT_AMBULATORY_CARE_PROVIDER_SITE_OTHER): Payer: Medicare Other | Admitting: Family Medicine

## 2020-01-24 ENCOUNTER — Other Ambulatory Visit: Payer: Self-pay

## 2020-01-24 ENCOUNTER — Encounter: Payer: Self-pay | Admitting: Family Medicine

## 2020-01-24 DIAGNOSIS — R21 Rash and other nonspecific skin eruption: Secondary | ICD-10-CM

## 2020-01-24 MED ORDER — METRONIDAZOLE 1 % EX GEL: Freq: Every day | CUTANEOUS | 0 refills | Status: DC

## 2020-01-24 NOTE — Progress Notes (Signed)
This visit occurred during the SARS-CoV-2 public health emergency.  Safety protocols were in place, including screening questions prior to the visit, additional usage of staff PPE, and extensive cleaning of exam room while observing appropriate contact time as indicated for disinfecting solutions.  Rash.  Going on for a few weeks.  B rash on the upper lip and distal nose with 1 pustule on the L upper lip (the pustule only recently started).   She wears a mask at work and uses CPAP at night (same equipment at baseline, in good shape per patient report).  No FCNAVD.  No new meds.  H/o similar, year ago.  Used bactroban w/o relief recently.  Rash is itchy.  No ulceration.  No intraoral lesions.  No ST.  No cough.  No h/o rosacea.    Lesions look less red today per patient report.  She tired mult otc meds in the meantime w/o total relief.  Tried triamcinolone and also topical abx and barrier cream w/o relief.    She doesn't have honey colored crusts.    Meds, vitals, and allergies reviewed.   ROS: Per HPI unless specifically indicated in ROS section   nad ncat except for small area of maculopapular erythema bilaterally on the skin superior to the upper lip and on the distal nose with a small pustule noted on the skin superior to the left upper lip.  Lips appear normal otherwise. She does not have a malar rash.

## 2020-01-26 DIAGNOSIS — R21 Rash and other nonspecific skin eruption: Secondary | ICD-10-CM | POA: Insufficient documentation

## 2020-01-26 NOTE — Assessment & Plan Note (Signed)
Likely perioral dermatitis.  She'll try metrogel and update me as needed.   She can use Neosporin on the pustule.  Okay for outpatient follow-up.

## 2020-01-29 ENCOUNTER — Ambulatory Visit
Admission: RE | Admit: 2020-01-29 | Discharge: 2020-01-29 | Disposition: A | Discharge status: Home / Self Care | Payer: Medicare Other | Source: Ambulatory Visit | Attending: Family Medicine | Admitting: Family Medicine

## 2020-01-29 DIAGNOSIS — Z1231 Encounter for screening mammogram for malignant neoplasm of breast: Secondary | ICD-10-CM | POA: Diagnosis not present

## 2020-02-05 ENCOUNTER — Ambulatory Visit: Payer: Medicare Other | Admitting: Dermatology

## 2020-03-26 NOTE — Progress Notes (Signed)
fu

## 2020-04-01 ENCOUNTER — Ambulatory Visit: Payer: Medicare Other | Admitting: Dermatology

## 2020-04-08 DIAGNOSIS — Z0000 Encounter for general adult medical examination without abnormal findings: Secondary | ICD-10-CM | POA: Insufficient documentation

## 2021-01-04 ENCOUNTER — Other Ambulatory Visit: Payer: Self-pay | Admitting: Family Medicine

## 2021-01-04 DIAGNOSIS — Z1231 Encounter for screening mammogram for malignant neoplasm of breast: Secondary | ICD-10-CM

## 2021-01-29 ENCOUNTER — Ambulatory Visit
Admission: RE | Admit: 2021-01-29 | Discharge: 2021-01-29 | Disposition: A | Discharge status: Home / Self Care | Payer: Medicare Other | Source: Ambulatory Visit | Attending: Family Medicine | Admitting: Family Medicine

## 2021-01-29 ENCOUNTER — Other Ambulatory Visit: Payer: Self-pay

## 2021-01-29 DIAGNOSIS — Z1231 Encounter for screening mammogram for malignant neoplasm of breast: Secondary | ICD-10-CM | POA: Insufficient documentation

## 2021-02-04 ENCOUNTER — Other Ambulatory Visit: Payer: Self-pay | Admitting: Family Medicine

## 2021-02-04 DIAGNOSIS — N6320 Unspecified lump in the left breast, unspecified quadrant: Secondary | ICD-10-CM

## 2021-02-04 DIAGNOSIS — R928 Other abnormal and inconclusive findings on diagnostic imaging of breast: Secondary | ICD-10-CM

## 2021-02-11 ENCOUNTER — Ambulatory Visit
Admission: RE | Admit: 2021-02-11 | Discharge: 2021-02-11 | Disposition: A | Discharge status: Home / Self Care | Payer: Medicare Other | Source: Ambulatory Visit | Attending: Family Medicine | Admitting: Family Medicine

## 2021-02-11 ENCOUNTER — Other Ambulatory Visit: Payer: Self-pay

## 2021-02-11 DIAGNOSIS — R928 Other abnormal and inconclusive findings on diagnostic imaging of breast: Secondary | ICD-10-CM | POA: Diagnosis not present

## 2021-02-11 DIAGNOSIS — N6320 Unspecified lump in the left breast, unspecified quadrant: Secondary | ICD-10-CM | POA: Insufficient documentation

## 2021-04-28 ENCOUNTER — Encounter (INDEPENDENT_AMBULATORY_CARE_PROVIDER_SITE_OTHER): Payer: Self-pay

## 2021-06-22 ENCOUNTER — Other Ambulatory Visit: Payer: Self-pay

## 2021-06-22 ENCOUNTER — Encounter (INDEPENDENT_AMBULATORY_CARE_PROVIDER_SITE_OTHER): Payer: Self-pay | Admitting: Bariatrics

## 2021-06-22 ENCOUNTER — Ambulatory Visit (INDEPENDENT_AMBULATORY_CARE_PROVIDER_SITE_OTHER): Payer: Medicare Other | Admitting: Bariatrics

## 2021-06-22 VITALS — BP 127/77 | HR 70 | Temp 98.0°F | Ht 64.0 in | Wt 297.0 lb

## 2021-06-22 DIAGNOSIS — Z1331 Encounter for screening for depression: Secondary | ICD-10-CM

## 2021-06-22 DIAGNOSIS — R5383 Other fatigue: Secondary | ICD-10-CM | POA: Diagnosis not present

## 2021-06-22 DIAGNOSIS — E669 Obesity, unspecified: Secondary | ICD-10-CM

## 2021-06-22 DIAGNOSIS — Z0289 Encounter for other administrative examinations: Secondary | ICD-10-CM

## 2021-06-22 DIAGNOSIS — E1159 Type 2 diabetes mellitus with other circulatory complications: Secondary | ICD-10-CM | POA: Diagnosis not present

## 2021-06-22 DIAGNOSIS — E538 Deficiency of other specified B group vitamins: Secondary | ICD-10-CM

## 2021-06-22 DIAGNOSIS — Z6841 Body Mass Index (BMI) 40.0 and over, adult: Secondary | ICD-10-CM | POA: Diagnosis not present

## 2021-06-22 DIAGNOSIS — R0602 Shortness of breath: Secondary | ICD-10-CM

## 2021-06-22 DIAGNOSIS — I152 Hypertension secondary to endocrine disorders: Secondary | ICD-10-CM

## 2021-06-22 DIAGNOSIS — E1169 Type 2 diabetes mellitus with other specified complication: Secondary | ICD-10-CM

## 2021-06-22 DIAGNOSIS — E559 Vitamin D deficiency, unspecified: Secondary | ICD-10-CM

## 2021-06-22 DIAGNOSIS — E785 Hyperlipidemia, unspecified: Secondary | ICD-10-CM

## 2021-06-22 DIAGNOSIS — G4733 Obstructive sleep apnea (adult) (pediatric): Secondary | ICD-10-CM

## 2021-06-22 DIAGNOSIS — K760 Fatty (change of) liver, not elsewhere classified: Secondary | ICD-10-CM

## 2021-06-23 ENCOUNTER — Encounter (INDEPENDENT_AMBULATORY_CARE_PROVIDER_SITE_OTHER): Payer: Self-pay | Admitting: Bariatrics

## 2021-06-23 DIAGNOSIS — E785 Hyperlipidemia, unspecified: Secondary | ICD-10-CM | POA: Insufficient documentation

## 2021-06-23 DIAGNOSIS — E1169 Type 2 diabetes mellitus with other specified complication: Secondary | ICD-10-CM | POA: Insufficient documentation

## 2021-06-23 LAB — COMPREHENSIVE METABOLIC PANEL
ALT: 23 IU/L (ref 0–32)
AST: 20 IU/L (ref 0–40)
Albumin/Globulin Ratio: 1.7 (ref 1.2–2.2)
Albumin: 4.6 g/dL (ref 3.8–4.8)
Alkaline Phosphatase: 76 IU/L (ref 44–121)
BUN/Creatinine Ratio: 15 (ref 12–28)
BUN: 11 mg/dL (ref 8–27)
Bilirubin Total: 0.6 mg/dL (ref 0.0–1.2)
CO2: 26 mmol/L (ref 20–29)
Calcium: 10.1 mg/dL (ref 8.7–10.3)
Chloride: 98 mmol/L (ref 96–106)
Creatinine, Ser: 0.73 mg/dL (ref 0.57–1.00)
Globulin, Total: 2.7 g/dL (ref 1.5–4.5)
Glucose: 131 mg/dL — ABNORMAL HIGH (ref 65–99)
Potassium: 4.1 mmol/L (ref 3.5–5.2)
Sodium: 141 mmol/L (ref 134–144)
Total Protein: 7.3 g/dL (ref 6.0–8.5)
eGFR: 90 mL/min/{1.73_m2} (ref >59)

## 2021-06-23 LAB — INSULIN, RANDOM: INSULIN: 28.6 u[IU]/mL — ABNORMAL HIGH (ref 2.6–24.9)

## 2021-06-23 LAB — LIPID PANEL WITH LDL/HDL RATIO
Cholesterol, Total: 142 mg/dL (ref 100–199)
HDL: 43 mg/dL (ref >39)
LDL Chol Calc (NIH): 64 mg/dL (ref 0–99)
LDL/HDL Ratio: 1.5 ratio (ref 0.0–3.2)
Triglycerides: 217 mg/dL — ABNORMAL HIGH (ref 0–149)
VLDL Cholesterol Cal: 35 mg/dL (ref 5–40)

## 2021-06-23 LAB — VITAMIN D 25 HYDROXY (VIT D DEFICIENCY, FRACTURES): Vit D, 25-Hydroxy: 45.2 ng/mL (ref 30.0–100.0)

## 2021-06-23 LAB — TSH: TSH: 4.08 u[IU]/mL (ref 0.450–4.500)

## 2021-06-23 LAB — HEMOGLOBIN A1C
Est. average glucose Bld gHb Est-mCnc: 117 mg/dL
Hgb A1c MFr Bld: 5.7 % — ABNORMAL HIGH (ref 4.8–5.6)

## 2021-06-23 LAB — T3: T3, Total: 127 ng/dL (ref 71–180)

## 2021-06-23 LAB — VITAMIN B12: Vitamin B-12: 747 pg/mL (ref 232–1245)

## 2021-06-23 LAB — MICROALBUMIN / CREATININE URINE RATIO
Creatinine, Urine: 21.8 mg/dL
Microalb/Creat Ratio: 21 mg/g creat (ref 0–29)
Microalbumin, Urine: 4.6 ug/mL

## 2021-06-23 LAB — T4, FREE: Free T4: 1.27 ng/dL (ref 0.82–1.77)

## 2021-06-23 NOTE — Progress Notes (Signed)
Chief Complaint:   [name] (MR# [id number]) is a 65 y.o. female who presents for evaluation and treatment of obesity and related comorbidities. Current BMI is Body mass index is 50.98 kg/m. [name] has been struggling with her weight for many years and has been unsuccessful in either losing weight, maintaining weight loss, or reaching her healthy weight goal.  [name] is currently in the action stage of change and ready to dedicate time achieving and maintaining a healthier weight. [name] is interested in becoming our patient and working on intensive lifestyle modifications including (but not limited to) diet and exercise for weight loss.  [name] likes to [employer].  She likes sweets.  [name]'s habits were reviewed today and are as follows: Her family eats meals together, she thinks her family will eat healthier with her, her desired weight loss is 122 pounds, she started gaining weight in her 7s, her heaviest weight ever was 309 pounds, she craves sweets, she skips lunch frequently, she is frequently drinking liquids with calories, she frequently eats larger portions than normal, and she struggles with emotional eating.  Depression Screen [name]'s Food and Mood (modified PHQ-9) score was 19.  Depression screen PHQ 2/9 06/22/2021  Decreased Interest 2  Down, Depressed, Hopeless 3  PHQ - 2 Score 5  Altered sleeping 2  Tired, decreased energy 3  Change in appetite 2  Feeling bad or failure about yourself  3  Trouble concentrating 2  Moving slowly or fidgety/restless 1  Suicidal thoughts 1  PHQ-9 Score 19  Difficult doing work/chores Somewhat difficult   Subjective:   1. Other fatigue [name] admits to daytime somnolence and denies waking up still tired. Patent has a history of symptoms of daytime fatigue and snoring. [name] generally gets  8-10  hours of sleep per night, and states that she has generally restful sleep. Snoring is present. Apneic episodes are present.  Epworth Sleepiness Score is 6.  2. SOB (shortness of breath) on exertion [name] notes increasing shortness of breath with exercising and seems to be worsening over time with weight gain. She notes getting out of breath sooner with activity than she used to. This has gotten worse recently. [name] denies shortness of breath at rest or orthopnea.  3. Hypertension associated with diabetes ([name]) Review: taking medications as instructed, no medication side effects noted, no chest pain on exertion, no dyspnea on exertion, no swelling of ankles.  Taking atenolol, lisinopril/HCTZ, and hydralazine.  Well controlled.    BP Readings from Last 3 Encounters:  06/22/21 127/77  01/24/20 140/80  12/13/16 140/68   4. Diabetes mellitus type 2 in obese ([name] Taking glipizide.  Diagnosed several years ago.  A1c approximately 6.5.  FBS 150s.  Lab Results  Component Value Date   HGBA1C 5.7 (H) 06/22/2021   Lab Results  Component Value Date   LDLCALC 64 06/22/2021   CREATININE 0.73 06/22/2021   Lab Results  Component Value Date   INSULIN 28.6 (H) 06/22/2021   5. Hyperlipidemia associated with type 2 diabetes mellitus ([name] Taking pravastatin.   Lab Results  Component Value Date   ALT 23 06/22/2021   AST 20 06/22/2021   ALKPHOS 76 06/22/2021   BILITOT 0.6 06/22/2021   Lab Results  Component Value Date   CHOL 142 06/22/2021   HDL 43 06/22/2021   LDLCALC 64 06/22/2021   TRIG 217 (H) 06/22/2021   6. OSA (obstructive sleep apnea) [name] has a diagnosis of sleep apnea. She reports that she  is using a CPAP regularly.  She is getting more restful sleep.  7. Vitamin D deficiency She is currently taking OTC vitamin D 5,000 IU each day. She denies nausea, vomiting or muscle weakness.  Lab Results  Component Value Date   VD25OH 45.2 06/22/2021   8. Fatty liver Noted on [place] on 4/20.  9. Vitamin B 12 deficiency She is taking a B complex vitamin.  Lab Results  Component Value Date    [id number] 06/22/2021   10. Depression screen [name] was screened for depression as part of her new patient workup today.  PHQ-9 is 19.  Assessment/Plan:   1. Other fatigue [name] does feel that her weight is causing her energy to be lower than it should be. Fatigue may be related to obesity, depression or many other causes. Labs will be ordered, and in the meanwhile, [name] will focus on self care including making healthy food choices, increasing physical activity and focusing on stress reduction.  Gradually increase activities.   - EKG 12-Lead - Vitamin B12 - Comprehensive metabolic panel - Hemoglobin A1c - Insulin, random - Lipid Panel With LDL/HDL Ratio - T3 - T4, free - TSH - VITAMIN D 25 Hydroxy (Vit-D Deficiency, Fractures)  2. SOB (shortness of breath) on exertion [name] does feel that she gets out of breath more easily that she used to when she exercises. [name]'s shortness of breath appears to be obesity related and exercise induced. She has agreed to work on weight loss and gradually increase exercise to treat her exercise induced shortness of breath. Will continue to monitor closely.  Gradually increase activities.   3. Hypertension associated with diabetes ([name]) [name] is working on healthy weight loss and exercise to improve blood pressure control. We will watch for signs of hypotension as she continues her lifestyle modifications.  Check CMP today.  Continue medications.  - Comprehensive metabolic panel  4. Diabetes mellitus type 2 in obese ([name] Good blood sugar control is important to decrease the likelihood of diabetic complications such as nephropathy, neuropathy, limb loss, blindness, coronary artery disease, and death. Intensive lifestyle modification including diet, exercise and weight loss are the first line of treatment for diabetes.  Continue glipizide.  Check FBS.  Check insulin level and urine microalbumin.  - Hemoglobin A1c - Insulin, random -  Microalbumin / creatinine urine ratio  5. Hyperlipidemia associated with type 2 diabetes mellitus ([name]) Cardiovascular risk and specific lipid/LDL goals reviewed.  We discussed several lifestyle modifications today and [name] will continue to work on diet, exercise and weight loss efforts. Orders and follow up as documented in patient record.  Continue pravastatin.  Check lipid panel.  Counseling Intensive lifestyle modifications are the first line treatment for this issue. Dietary changes: Increase soluble fiber. Decrease simple carbohydrates. Exercise changes: Moderate to vigorous-intensity aerobic activity 150 minutes per week if tolerated. Lipid-lowering medications: see documented in medical record.  - Comprehensive metabolic panel - Lipid Panel With LDL/HDL Ratio  6. OSA (obstructive sleep apnea) Intensive lifestyle modifications are the first line treatment for this issue. We discussed several lifestyle modifications today and she will continue to work on diet, exercise and weight loss efforts. We will continue to monitor. Orders and follow up as documented in patient record.   Continue CPAP nightly.   7. Vitamin D deficiency Low Vitamin D level contributes to fatigue and are associated with obesity, breast, and colon cancer. She agrees to continue to take daily OTC vitamin D.and we will check vitamin D level  today.  - VITAMIN D 25 Hydroxy (Vit-D Deficiency, Fractures)  8. Fatty liver Will check CMP today.  - Comprehensive metabolic panel  9. Vitamin B 12 deficiency The diagnosis was reviewed with the patient. Counseling provided today, see below. We will continue to monitor. Orders and follow up as documented in patient record.  Will check B12 level today.  Counseling The body needs vitamin B12: to make red blood cells; to make DNA; and to help the nerves work properly so they can carry messages from the brain to the body.  The main causes of vitamin B12 deficiency include  dietary deficiency, digestive diseases, pernicious anemia, and having a surgery in which part of the stomach or small intestine is removed.  Certain medicines can make it harder for the body to absorb vitamin B12. These medicines include: heartburn medications; some antibiotics; some medications used to treat diabetes, gout, and high cholesterol.  In some cases, there are no symptoms of this condition. If the condition leads to anemia or nerve damage, various symptoms can occur, such as weakness or fatigue, shortness of breath, and numbness or tingling in your hands and feet.   Treatment:  May include taking vitamin B12 supplements.  Avoid alcohol.  Eat lots of healthy foods that contain vitamin B12: Beef, pork, chicken, [place], and organ meats, such as liver.  Seafood: This includes clams, rainbow trout, salmon, tuna, and haddock. Eggs.  Cereal and dairy products that are fortified: This means that vitamin B12 has been added to the food.   - Vitamin B12  10. Depression screen [name] had a positive depression screening. Depression is commonly associated with obesity and often results in emotional eating behaviors. We will monitor this closely and work on CBT to help improve the non-hunger eating patterns. Referral to Psychology may be required if no improvement is seen as she continues in our clinic.  11. Class 3 severe obesity with serious comorbidity and body mass index (BMI) of 50.0 to 59.9 in adult, unspecified obesity type ([name]  [name] is currently in the action stage of change and her goal is to continue with weight loss efforts. I recommend [name] begin the structured treatment plan as follows:  She has agreed to the Category 3 Plan.  She will work on meal planning and intentional eating.  Exercise goals: No exercise has been prescribed at this time.   Behavioral modification strategies: increasing lean protein intake, decreasing simple carbohydrates, increasing vegetables,  increasing water intake, decreasing eating out, no skipping meals, meal planning and cooking strategies, keeping healthy foods in the home, and planning for success.  She was informed of the importance of frequent follow-up visits to maximize her success with intensive lifestyle modifications for her multiple health conditions. She was informed we would discuss her lab results at her next visit unless there is a critical issue that needs to be addressed sooner. [name] agreed to keep her next visit at the agreed upon time to discuss these results.  Objective:   Blood pressure 127/77, pulse 70, temperature 98 F (36.7 C), height '5\' 4"'$  (1.626 m), weight 297 lb (134.7 kg), SpO2 95 %. Body mass index is 50.98 kg/m.  EKG: Normal sinus rhythm, rate 68 bpm.  Indirect Calorimeter completed today shows a VO2 of 280 and a REE of 1948.  Her calculated basal metabolic rate is [id number] thus her basal metabolic rate is better than expected.  General: Cooperative, alert, well developed, in no acute distress. HEENT: Conjunctivae and lids unremarkable. Cardiovascular: Regular  rhythm.  Lungs: Normal work of breathing. Neurologic: No focal deficits.   Lab Results  Component Value Date   CREATININE 0.73 06/22/2021   BUN 11 06/22/2021   NA 141 06/22/2021   K 4.1 06/22/2021   CL 98 06/22/2021   CO2 26 06/22/2021   Lab Results  Component Value Date   ALT 23 06/22/2021   AST 20 06/22/2021   ALKPHOS 76 06/22/2021   BILITOT 0.6 06/22/2021   Lab Results  Component Value Date   HGBA1C 5.7 (H) 06/22/2021   Lab Results  Component Value Date   INSULIN 28.6 (H) 06/22/2021   Lab Results  Component Value Date   TSH 4.080 06/22/2021   Lab Results  Component Value Date   CHOL 142 06/22/2021   HDL 43 06/22/2021   LDLCALC 64 06/22/2021   TRIG 217 (H) 06/22/2021   Lab Results  Component Value Date   WBC 6.2 03/09/2016   HGB 15.0 12/13/2016   HCT 44.0 12/13/2016   MCV 90 03/09/2016   PLT 304  03/09/2016   Obesity Behavioral Intervention:   Approximately 15 minutes were spent on the discussion below.  ASK: We discussed the diagnosis of obesity with [name] today and [name] agreed to give [place] permission to discuss obesity behavioral modification therapy today.  ASSESS: [name] has the diagnosis of obesity and her BMI today is 51.0. [name] is in the action stage of change.   ADVISE: [name] was educated on the multiple health risks of obesity as well as the benefit of weight loss to improve her health. She was advised of the need for long term treatment and the importance of lifestyle modifications to improve her current health and to decrease her risk of future health problems.  AGREE: Multiple dietary modification options and treatment options were discussed and [name] agreed to follow the recommendations documented in the above note.  ARRANGE: [name] was educated on the importance of frequent visits to treat obesity as outlined per CMS and USPSTF guidelines and agreed to schedule her next follow up appointment today.  Attestation Statements:   Reviewed by clinician on day of visit: allergies, medications, problem list, medical history, surgical history, family history, social history, and previous encounter notes.  I, Water quality scientist, CMA, am acting as Location manager for [employer], DO  I have reviewed the above documentation for accuracy and completeness, and I agree with the above. [name], DO

## 2021-06-24 ENCOUNTER — Encounter (INDEPENDENT_AMBULATORY_CARE_PROVIDER_SITE_OTHER): Payer: Self-pay | Admitting: Bariatrics

## 2021-07-06 ENCOUNTER — Encounter (INDEPENDENT_AMBULATORY_CARE_PROVIDER_SITE_OTHER): Payer: Self-pay | Admitting: Bariatrics

## 2021-07-06 ENCOUNTER — Ambulatory Visit (INDEPENDENT_AMBULATORY_CARE_PROVIDER_SITE_OTHER): Payer: Medicare Other | Admitting: Bariatrics

## 2021-07-06 ENCOUNTER — Other Ambulatory Visit: Payer: Self-pay

## 2021-07-06 VITALS — BP 128/82 | HR 71 | Temp 98.1°F | Ht 64.0 in | Wt 291.0 lb

## 2021-07-06 DIAGNOSIS — Z6841 Body Mass Index (BMI) 40.0 and over, adult: Secondary | ICD-10-CM

## 2021-07-06 DIAGNOSIS — E559 Vitamin D deficiency, unspecified: Secondary | ICD-10-CM

## 2021-07-06 DIAGNOSIS — E781 Pure hyperglyceridemia: Secondary | ICD-10-CM | POA: Diagnosis not present

## 2021-07-06 DIAGNOSIS — E1169 Type 2 diabetes mellitus with other specified complication: Secondary | ICD-10-CM

## 2021-07-07 NOTE — Progress Notes (Signed)
Chief Complaint:   OBESITY [name] is here to discuss her progress with her obesity treatment plan along with follow-up of her obesity related diagnoses. [name] is on the Category 3 Plan and states she is following her eating plan approximately 98% of the time. [name] states she is doing 0 minutes 0 times per week.  Today's visit was #: 3 Starting weight: 297 lbs Starting date: 06/22/2021 Today's weight: 291 lbs Today's date:07/06/2021 Total lbs lost to date: 6 lbs Total lbs lost since last in-office visit: 6lbs  Interim History: [name] is down 6 lbs. She states that she is not always getting her snacks and she missed lunch today.  Subjective:   1. Type 2 diabetes mellitus with other specified complication, without long-term current use of insulin ([name] [name] was taking Metformin but stopped due to diarrhea. Her FBS: 117, A1C = 5.7 and Insulin resistance 28.6. She denies hunger at night.  2. Hypertriglyceridemia [name]'s Triglycerides level was 217.  3. Vitamin D deficiency She is currently taking prescription vitamin D 5,000 IU each week. She denies nausea, vomiting, muscle weakness or stress. Her Vitamin D level was 45.2.  Assessment/Plan:   1. Type 2 diabetes mellitus with other specified complication, without long-term current use of insulin ([name] [name] will continue medication for now, but will consider decreasing or stop in the future. Good blood sugar control is important to decrease the likelihood of diabetic complications such as nephropathy, neuropathy, limb loss, blindness, coronary artery disease, and death. Intensive lifestyle modification including diet, exercise and weight loss are the first line of treatment for diabetes.   2. Hypertriglyceridemia [name] will decrease carbohydrates. She will increase her exercise.   3. Vitamin D deficiency Low Vitamin D level contributes to fatigue and are associated with obesity, breast, and colon cancer. [name] agrees to  continue to take prescription Vitamin D 5,000 IU every week and will follow-up for routine testing of Vitamin D, at least 2-3 times per year to avoid over-replacement.   4. Obesity, current BMI 50 [name] is currently in the action stage of change. As such, her goal is to continue with weight loss efforts. She has agreed to the Category 3 Plan.  [name] will continue meal planning. She will continue intentional eating. I reviewed labs with [name] from 06/22/2021. She will make smart fruit choices. Handout: [employer] was given today.  Exercise goals: No exercise has been prescribed at this time.  Behavioral modification strategies: increasing lean protein intake, decreasing simple carbohydrates, increasing vegetables, increasing water intake, decreasing eating out, no skipping meals, meal planning and cooking strategies, keeping healthy foods in the home, and planning for success.  [name] has agreed to follow-up with our clinic in 2 weeks. She was informed of the importance of frequent follow-up visits to maximize her success with intensive lifestyle modifications for her multiple health conditions.   Objective:   Blood pressure 128/82, pulse 71, temperature 98.1 F (36.7 C), height '5\' 4"'$  (1.626 m), weight 291 lb (132 kg), SpO2 96 %. Body mass index is 49.95 kg/m.  General: Cooperative, alert, well developed, in no acute distress. HEENT: Conjunctivae and lids unremarkable. Cardiovascular: Regular rhythm.  Lungs: Normal work of breathing. Neurologic: No focal deficits.   Lab Results  Component Value Date   CREATININE 0.73 06/22/2021   BUN 11 06/22/2021   NA 141 06/22/2021   K 4.1 06/22/2021   CL 98 06/22/2021   CO2 26 06/22/2021   Lab Results  Component Value Date  ALT 23 06/22/2021   AST 20 06/22/2021   ALKPHOS 76 06/22/2021   BILITOT 0.6 06/22/2021   Lab Results  Component Value Date   HGBA1C 5.7 (H) 06/22/2021   Lab Results  Component Value Date   INSULIN 28.6 (H)  06/22/2021   Lab Results  Component Value Date   TSH 4.080 06/22/2021   Lab Results  Component Value Date   CHOL 142 06/22/2021   HDL 43 06/22/2021   LDLCALC 64 06/22/2021   TRIG 217 (H) 06/22/2021   Lab Results  Component Value Date   VD25OH 45.2 06/22/2021   Lab Results  Component Value Date   WBC 6.2 03/09/2016   HGB 15.0 12/13/2016   HCT 44.0 12/13/2016   MCV 90 03/09/2016   PLT 304 03/09/2016   No results found for: IRON, TIBC, FERRITIN  Obesity Behavioral Intervention:   Approximately 15 minutes were spent on the discussion below.  ASK: We discussed the diagnosis of obesity with [name] today and [name] agreed to give [place] permission to discuss obesity behavioral modification therapy today.  ASSESS: [name] has the diagnosis of obesity and her BMI today is 50.9. [name] is in the action stage of change.   ADVISE: [name] was educated on the multiple health risks of obesity as well as the benefit of weight loss to improve her health. She was advised of the need for long term treatment and the importance of lifestyle modifications to improve her current health and to decrease her risk of future health problems.  AGREE: Multiple dietary modification options and treatment options were discussed and [name] agreed to follow the recommendations documented in the above note.  ARRANGE: John was educated on the importance of frequent visits to treat obesity as outlined per CMS and USPSTF guidelines and agreed to schedule her next follow up appointment today.  Attestation Statements:   Reviewed by clinician on day of visit: allergies, medications, problem list, medical history, surgical history, family history, social history, and previous encounter notes.   I, Lizbeth Bark, RMA, am acting as Location manager for [employer], DO.   I have reviewed the above documentation for accuracy and completeness, and I agree with the above. [name], DO

## 2021-07-09 ENCOUNTER — Encounter (INDEPENDENT_AMBULATORY_CARE_PROVIDER_SITE_OTHER): Payer: Self-pay | Admitting: Bariatrics

## 2021-07-20 ENCOUNTER — Encounter (INDEPENDENT_AMBULATORY_CARE_PROVIDER_SITE_OTHER): Payer: Self-pay | Admitting: Bariatrics

## 2021-07-20 ENCOUNTER — Other Ambulatory Visit: Payer: Self-pay

## 2021-07-20 ENCOUNTER — Ambulatory Visit (INDEPENDENT_AMBULATORY_CARE_PROVIDER_SITE_OTHER): Payer: Medicare Other | Admitting: Bariatrics

## 2021-07-20 VITALS — BP 99/65 | HR 73 | Temp 98.3°F | Ht 64.0 in | Wt 286.0 lb

## 2021-07-20 DIAGNOSIS — Z6841 Body Mass Index (BMI) 40.0 and over, adult: Secondary | ICD-10-CM

## 2021-07-20 DIAGNOSIS — E1169 Type 2 diabetes mellitus with other specified complication: Secondary | ICD-10-CM | POA: Diagnosis not present

## 2021-07-20 DIAGNOSIS — E781 Pure hyperglyceridemia: Secondary | ICD-10-CM | POA: Diagnosis not present

## 2021-07-21 ENCOUNTER — Encounter (INDEPENDENT_AMBULATORY_CARE_PROVIDER_SITE_OTHER): Payer: Self-pay | Admitting: Bariatrics

## 2021-07-21 NOTE — Progress Notes (Signed)
Chief Complaint:   OBESITY [name] is here to discuss her progress with her obesity treatment plan along with follow-up of her obesity related diagnoses. [name] is on the Category 3 Plan and states she is following her eating plan approximately 98% of the time. [name] states she is doing 0 minutes 0 times per week.  Today's visit was #: 4 Starting weight: 297 lbs Starting date: 06/22/2021 Today's weight: 286 lbs Today's date:07/20/2021 Total lbs lost to date: 11 lbs Total lbs lost since last in-office visit: 5 lbs  Interim History: [name] is down 5 lbs since the last visit. She states that she is doing well with the plan. She occasionally has a "sweet tooth".   Subjective:   1. Hypertriglyceridemia [name] is currently taking Pravachol.   2. Type 2 diabetes mellitus with other specified complication, without long-term current use of insulin ([name]) [name] is currently taking Glucotrol and Gluco Shield at [employer].    Assessment/Plan:   1. Hypertriglyceridemia Cardiovascular risk and specific lipid/LDL goals reviewed.  [name] will continue taking Pravachol and she will have zero trans fats. We discussed several lifestyle modifications today and [name] will continue to work on diet, exercise and weight loss efforts. Orders and follow up as documented in patient record.   Counseling Intensive lifestyle modifications are the first line treatment for this issue. Dietary changes: Increase soluble fiber. Decrease simple carbohydrates. Exercise changes: Moderate to vigorous-intensity aerobic activity 150 minutes per week if tolerated. Lipid-lowering medications: see documented in medical record.   2. Type 2 diabetes mellitus with other specified complication, without long-term current use of insulin ([name]) [name] will continue medication. She will decrease carbohydrates. Good blood sugar control is important to decrease the likelihood of diabetic complications such as nephropathy, neuropathy,  limb loss, blindness, coronary artery disease, and death. Intensive lifestyle modification including diet, exercise and weight loss are the first line of treatment for diabetes.    3. Obesity, current BMI 49.1 [name] is currently in the action stage of change. As such, her goal is to continue with weight loss efforts. She has agreed to the Category 3 Plan and keeping a food journal and adhering to recommended goals of 450-600 calories and 40+ grams of protein.   [name] will continue meal planning. She will continue intentional eating. She will increase H2O. Recipes II were given today.  Exercise goals: No exercise has been prescribed at this time.  Behavioral modification strategies: increasing lean protein intake, decreasing simple carbohydrates, increasing vegetables, increasing water intake, decreasing eating out, no skipping meals, meal planning and cooking strategies, keeping healthy foods in the home, and planning for success.  [name] has agreed to follow-up with our clinic in 2 weeks. She was informed of the importance of frequent follow-up visits to maximize her success with intensive lifestyle modifications for her multiple health conditions.   Objective:   Blood pressure 99/65, pulse 73, temperature 98.3 F (36.8 C), height '5\' 4"'$  (1.626 m), weight 286 lb (129.7 kg), SpO2 95 %. Body mass index is 49.09 kg/m.  General: Cooperative, alert, well developed, in no acute distress. HEENT: Conjunctivae and lids unremarkable. Cardiovascular: Regular rhythm.  Lungs: Normal work of breathing. Neurologic: No focal deficits.   Lab Results  Component Value Date   CREATININE 0.73 06/22/2021   BUN 11 06/22/2021   NA 141 06/22/2021   K 4.1 06/22/2021   CL 98 06/22/2021   CO2 26 06/22/2021   Lab Results  Component Value Date   ALT 23 06/22/2021  AST 20 06/22/2021   ALKPHOS 76 06/22/2021   BILITOT 0.6 06/22/2021   Lab Results  Component Value Date   HGBA1C 5.7 (H) 06/22/2021    Lab Results  Component Value Date   INSULIN 28.6 (H) 06/22/2021   Lab Results  Component Value Date   TSH 4.080 06/22/2021   Lab Results  Component Value Date   CHOL 142 06/22/2021   HDL 43 06/22/2021   LDLCALC 64 06/22/2021   TRIG 217 (H) 06/22/2021   Lab Results  Component Value Date   VD25OH 45.2 06/22/2021   Lab Results  Component Value Date   WBC 6.2 03/09/2016   HGB 15.0 12/13/2016   HCT 44.0 12/13/2016   MCV 90 03/09/2016   PLT 304 03/09/2016   No results found for: IRON, TIBC, FERRITIN  Obesity Behavioral Intervention:   Approximately 15 minutes were spent on the discussion below.  ASK: We discussed the diagnosis of obesity with [name] today and [name] agreed to give [place] permission to discuss obesity behavioral modification therapy today.  ASSESS: [name] has the diagnosis of obesity and her BMI today is 49.1. Earlee is in the action stage of change.   ADVISE: [name] was educated on the multiple health risks of obesity as well as the benefit of weight loss to improve her health. She was advised of the need for long term treatment and the importance of lifestyle modifications to improve her current health and to decrease her risk of future health problems.  AGREE: Multiple dietary modification options and treatment options were discussed and [name] agreed to follow the recommendations documented in the above note.  ARRANGE: [name] was educated on the importance of frequent visits to treat obesity as outlined per CMS and USPSTF guidelines and agreed to schedule her next follow up appointment today.  Attestation Statements:   Reviewed by clinician on day of visit: allergies, medications, problem list, medical history, surgical history, family history, social history, and previous encounter notes.  I, [name], RMA, am acting as Location manager for [employer], DO.   I have reviewed the above documentation for accuracy and completeness, and I agree  with the above. Erin Lesch, DO

## 2021-08-16 ENCOUNTER — Ambulatory Visit (INDEPENDENT_AMBULATORY_CARE_PROVIDER_SITE_OTHER): Payer: Medicare Other | Admitting: Bariatrics

## 2021-08-16 ENCOUNTER — Encounter (INDEPENDENT_AMBULATORY_CARE_PROVIDER_SITE_OTHER): Payer: Self-pay

## 2021-08-19 ENCOUNTER — Ambulatory Visit (INDEPENDENT_AMBULATORY_CARE_PROVIDER_SITE_OTHER): Payer: Medicare Other | Admitting: Bariatrics

## 2021-08-19 ENCOUNTER — Other Ambulatory Visit: Payer: Self-pay

## 2021-08-19 ENCOUNTER — Encounter (INDEPENDENT_AMBULATORY_CARE_PROVIDER_SITE_OTHER): Payer: Self-pay | Admitting: Bariatrics

## 2021-08-19 VITALS — BP 100/72 | HR 63 | Temp 98.0°F | Ht 64.0 in | Wt 286.0 lb

## 2021-08-19 DIAGNOSIS — E1169 Type 2 diabetes mellitus with other specified complication: Secondary | ICD-10-CM

## 2021-08-19 DIAGNOSIS — E785 Hyperlipidemia, unspecified: Secondary | ICD-10-CM | POA: Diagnosis not present

## 2021-08-19 DIAGNOSIS — Z6841 Body Mass Index (BMI) 40.0 and over, adult: Secondary | ICD-10-CM | POA: Diagnosis not present

## 2021-08-19 NOTE — Progress Notes (Signed)
Chief Complaint:   OBESITY [name] is here to discuss her progress with her obesity treatment plan along with follow-up of her obesity related diagnoses. [name] is on the Category 3 Plan and states she is following her eating plan approximately 85% of the time. [name] states she is doing 0 minutes 0 times per week.  Today's visit was #: 5 Starting weight: 297 lbs Starting date: 06/22/2021 Today's weight: 286 lbs Today's date:08/19/2021 Total lbs lost to date: 11 lbs Total lbs lost since last in-office visit: 0  Interim History: [name]'s weight remains the same since her last visit. She had multiple celebrations. She is getting more water in.  Subjective:   1. Hyperlipidemia associated with type 2 diabetes mellitus ([place]) [name] is taking Pravachol currently.  2. Type 2 diabetes mellitus with other specified complication, without long-term current use of insulin ([name] [name] is currently taking Glipizide. Her fasting blood sugar was 120's. She has no lows.  Assessment/Plan:   1. Hyperlipidemia associated with type 2 diabetes mellitus ([place]) Cardiovascular risk and specific lipid/LDL goals reviewed.  We discussed several lifestyle modifications today and [name] will continue to work on diet, exercise and weight loss efforts. [name] will continue taking Pravachol. Orders and follow up as documented in patient record.   Counseling Intensive lifestyle modifications are the first line treatment for this issue. Dietary changes: Increase soluble fiber. Decrease simple carbohydrates. Exercise changes: Moderate to vigorous-intensity aerobic activity 150 minutes per week if tolerated. Lipid-lowering medications: see documented in medical record.   2. Type 2 diabetes mellitus with other specified complication, without long-term current use of insulin ([place]) [name] will continue medications. Good blood sugar control is important to decrease the likelihood of diabetic complications such as  nephropathy, neuropathy, limb loss, blindness, coronary artery disease, and death. Intensive lifestyle modification including diet, exercise and weight loss are the first line of treatment for diabetes.   3. Obesity, current BMI 49.1 [name] is currently in the action stage of change. As such, her goal is to continue with weight loss efforts. She has agreed to the Category 3 Plan.   [name] will continue meal planning. She will be mindful eating. She will increase her water intake.  Exercise goals: No exercise has been prescribed at this time.  Behavioral modification strategies: increasing lean protein intake, decreasing simple carbohydrates, increasing vegetables, increasing water intake, decreasing eating out, no skipping meals, meal planning and cooking strategies, keeping healthy foods in the home, and planning for success.  [name] has agreed to follow-up with our clinic in 2 weeks. She was informed of the importance of frequent follow-up visits to maximize her success with intensive lifestyle modifications for her multiple health conditions.   Objective:   Blood pressure 100/72, pulse 63, temperature 98 F (36.7 C), height 5\' 4"  (1.626 m), weight 286 lb (129.7 kg), SpO2 97 %. Body mass index is 49.09 kg/m.  General: Cooperative, alert, well developed, in no acute distress. HEENT: Conjunctivae and lids unremarkable. Cardiovascular: Regular rhythm.  Lungs: Normal work of breathing. Neurologic: No focal deficits.   Lab Results  Component Value Date   CREATININE 0.73 06/22/2021   BUN 11 06/22/2021   NA 141 06/22/2021   K 4.1 06/22/2021   CL 98 06/22/2021   CO2 26 06/22/2021   Lab Results  Component Value Date   ALT 23 06/22/2021   AST 20 06/22/2021   ALKPHOS 76 06/22/2021   BILITOT 0.6 06/22/2021   Lab Results  Component Value Date  HGBA1C 5.7 (H) 06/22/2021   Lab Results  Component Value Date   INSULIN 28.6 (H) 06/22/2021   Lab Results  Component Value Date   TSH  4.080 06/22/2021   Lab Results  Component Value Date   CHOL 142 06/22/2021   HDL 43 06/22/2021   LDLCALC 64 06/22/2021   TRIG 217 (H) 06/22/2021   Lab Results  Component Value Date   VD25OH 45.2 06/22/2021   Lab Results  Component Value Date   WBC 6.2 03/09/2016   HGB 15.0 12/13/2016   HCT 44.0 12/13/2016   MCV 90 03/09/2016   PLT 304 03/09/2016   No results found for: IRON, TIBC, FERRITIN  Attestation Statements:   Reviewed by clinician on day of visit: allergies, medications, problem list, medical history, surgical history, family history, social history, and previous encounter notes.  Time spent on visit including pre-visit chart review and post-visit care and charting was 20 minutes.   I, Lizbeth Bark, RMA, am acting as Location manager for [employer], DO.   I have reviewed the above documentation for accuracy and completeness, and I agree with the above. [name], DO

## 2021-08-20 ENCOUNTER — Encounter (INDEPENDENT_AMBULATORY_CARE_PROVIDER_SITE_OTHER): Payer: Self-pay | Admitting: Bariatrics

## 2021-09-06 ENCOUNTER — Ambulatory Visit (INDEPENDENT_AMBULATORY_CARE_PROVIDER_SITE_OTHER): Payer: Medicare Other | Admitting: Physician Assistant

## 2021-09-06 ENCOUNTER — Other Ambulatory Visit: Payer: Self-pay

## 2021-09-06 ENCOUNTER — Encounter (INDEPENDENT_AMBULATORY_CARE_PROVIDER_SITE_OTHER): Payer: Self-pay | Admitting: Physician Assistant

## 2021-09-06 VITALS — BP 114/70 | HR 65 | Temp 98.1°F | Ht 64.0 in | Wt 285.0 lb

## 2021-09-06 DIAGNOSIS — Z6841 Body Mass Index (BMI) 40.0 and over, adult: Secondary | ICD-10-CM | POA: Diagnosis not present

## 2021-09-06 DIAGNOSIS — E1169 Type 2 diabetes mellitus with other specified complication: Secondary | ICD-10-CM

## 2021-09-06 NOTE — Progress Notes (Signed)
Chief Complaint:   OBESITY Makila is here to discuss her progress with her obesity treatment plan along with follow-up of her obesity related diagnoses. Rethel is on the Category 3 Plan and states she is following her eating plan approximately 85% of the time. Hiilani states she is walking 20 minutes 2 times per week.  Today's visit was #: 6 Starting weight: 297 lbs Starting date: 06/22/2021 Today's weight: 285 lbs Today's date: 09/06/2021 Total lbs lost to date: 12 Total lbs lost since last in-office visit: 1  Interim History: Joyceline states that she has "fallen off the wagon" and she has been eating out more than normal. She tends to eat a late breakfast and skips lunch.  Subjective:   1. Type 2 diabetes mellitus with other specified complication, without long-term current use of insulin ([name] Pt's fasting blood sugars run 117-120's and she denies hypoglycemia. Pt is on glipizide. Her last A1c was 5.2, managed by PCP.  Assessment/Plan:   1. Type 2 diabetes mellitus with other specified complication, without long-term current use of insulin ([name] Good blood sugar control is important to decrease the likelihood of diabetic complications such as nephropathy, neuropathy, limb loss, blindness, coronary artery disease, and death. Intensive lifestyle modification including diet, exercise and weight loss are the first line of treatment for diabetes. Continue with medication. Pt will follow up with PCP in November.  2. Obesity, current BMI 48.9  Maria is currently in the action stage of change. As such, her goal is to continue with weight loss efforts. She has agreed to the Category 3 Plan.   Exercise goals:  As is  Behavioral modification strategies: decreasing eating out and meal planning and cooking strategies.  Johnni has agreed to follow-up with our clinic in 2-3 weeks. She was informed of the importance of frequent follow-up visits to maximize her success with intensive lifestyle  modifications for her multiple health conditions.   Objective:   Blood pressure 114/70, pulse 65, temperature 98.1 F (36.7 C), height 5\' 4"  (1.626 m), weight 285 lb (129.3 kg), SpO2 94 %. Body mass index is 48.92 kg/m.  General: Cooperative, alert, well developed, in no acute distress. HEENT: Conjunctivae and lids unremarkable. Cardiovascular: Regular rhythm.  Lungs: Normal work of breathing. Neurologic: No focal deficits.   Lab Results  Component Value Date   CREATININE 0.73 06/22/2021   BUN 11 06/22/2021   NA 141 06/22/2021   K 4.1 06/22/2021   CL 98 06/22/2021   CO2 26 06/22/2021   Lab Results  Component Value Date   ALT 23 06/22/2021   AST 20 06/22/2021   ALKPHOS 76 06/22/2021   BILITOT 0.6 06/22/2021   Lab Results  Component Value Date   HGBA1C 5.7 (H) 06/22/2021   Lab Results  Component Value Date   INSULIN 28.6 (H) 06/22/2021   Lab Results  Component Value Date   TSH 4.080 06/22/2021   Lab Results  Component Value Date   CHOL 142 06/22/2021   HDL 43 06/22/2021   LDLCALC 64 06/22/2021   TRIG 217 (H) 06/22/2021   Lab Results  Component Value Date   VD25OH 45.2 06/22/2021   Lab Results  Component Value Date   WBC 6.2 03/09/2016   HGB 15.0 12/13/2016   HCT 44.0 12/13/2016   MCV 90 03/09/2016   PLT 304 03/09/2016   No results found for: IRON, TIBC, FERRITIN  Obesity Behavioral Intervention:   Approximately 15 minutes were spent on the discussion below.  ASK:  We discussed the diagnosis of obesity with Helayna today and Shae agreed to give [place] permission to discuss obesity behavioral modification therapy today.  ASSESS: Irini has the diagnosis of obesity and her BMI today is 49.0. Amaranta is in the action stage of change.   ADVISE: Skyrah was educated on the multiple health risks of obesity as well as the benefit of weight loss to improve her health. She was advised of the need for long term treatment and the importance of lifestyle  modifications to improve her current health and to decrease her risk of future health problems.  AGREE: Multiple dietary modification options and treatment options were discussed and Tynesha agreed to follow the recommendations documented in the above note.  ARRANGE: Brittanni was educated on the importance of frequent visits to treat obesity as outlined per CMS and USPSTF guidelines and agreed to schedule her next follow up appointment today.  Attestation Statements:   Reviewed by clinician on day of visit: allergies, medications, problem list, medical history, surgical history, family history, social history, and previous encounter notes.  Coral Ceo, CMA, am acting as transcriptionist for [employer], PA-C.  I have reviewed the above documentation for accuracy and completeness, and I agree with the above. Abby Potash, PA-C

## 2021-09-22 ENCOUNTER — Ambulatory Visit (INDEPENDENT_AMBULATORY_CARE_PROVIDER_SITE_OTHER): Payer: Medicare Other | Admitting: Physician Assistant

## 2021-10-11 ENCOUNTER — Ambulatory Visit (INDEPENDENT_AMBULATORY_CARE_PROVIDER_SITE_OTHER): Payer: Medicare Other | Admitting: Physician Assistant

## 2021-10-12 ENCOUNTER — Other Ambulatory Visit: Payer: Self-pay

## 2021-10-12 ENCOUNTER — Ambulatory Visit (INDEPENDENT_AMBULATORY_CARE_PROVIDER_SITE_OTHER): Payer: Medicare Other | Admitting: Bariatrics

## 2021-10-12 ENCOUNTER — Encounter (INDEPENDENT_AMBULATORY_CARE_PROVIDER_SITE_OTHER): Payer: Self-pay | Admitting: Bariatrics

## 2021-10-12 VITALS — BP 111/79 | HR 70 | Temp 97.8°F | Ht 64.0 in | Wt 285.0 lb

## 2021-10-12 DIAGNOSIS — E669 Obesity, unspecified: Secondary | ICD-10-CM

## 2021-10-12 DIAGNOSIS — E1169 Type 2 diabetes mellitus with other specified complication: Secondary | ICD-10-CM

## 2021-10-12 DIAGNOSIS — E1159 Type 2 diabetes mellitus with other circulatory complications: Secondary | ICD-10-CM

## 2021-10-12 DIAGNOSIS — E785 Hyperlipidemia, unspecified: Secondary | ICD-10-CM | POA: Diagnosis not present

## 2021-10-12 DIAGNOSIS — I152 Hypertension secondary to endocrine disorders: Secondary | ICD-10-CM

## 2021-10-12 DIAGNOSIS — Z6841 Body Mass Index (BMI) 40.0 and over, adult: Secondary | ICD-10-CM

## 2021-10-12 NOTE — Progress Notes (Signed)
Chief Complaint:   OBESITY [name] is here to discuss her progress with her obesity treatment plan along with follow-up of her obesity related diagnoses. [name] is on the Category 3 Plan and states she is following her eating plan approximately 80% of the time. [name] states she is not exercising at this time.  Today's visit was #: 7 Starting weight: 297 lbs Starting date: 06/22/2021 Today's weight: 285 lbs Today's date: 10/12/2021 Total lbs lost to date: 12 lbs Total lbs lost since last in-office visit: 0  Interim History: [name]'s weight remains the same as at last visit.  She is doing better with her water intake and trying to get in her protein.  Subjective:   1. Hyperlipidemia associated with type 2 diabetes mellitus ([name]) [name] is taking Pravachol.  2. Hypertension associated with diabetes ([name]) Taking Tenormin and Zestoretic.  3. Diabetes mellitus type 2 in obese [name]) She was taking glipizide, but stopped it per her PCP.  Assessment/Plan:   1. Hyperlipidemia associated with type 2 diabetes mellitus ([name] Continue Pravachol.  Cardiovascular risk and specific lipid/LDL goals reviewed.  We discussed several lifestyle modifications today and [name] will continue to work on diet, exercise and weight loss efforts. Orders and follow up as documented in patient record.   Counseling Intensive lifestyle modifications are the first line treatment for this issue. Dietary changes: Increase soluble fiber. Decrease simple carbohydrates. Exercise changes: Moderate to vigorous-intensity aerobic activity 150 minutes per week if tolerated. Lipid-lowering medications: see documented in medical record.  2. Hypertension associated with diabetes ([place]) Continue Medications.  No added dietary salt.  Continue to exercise.  [name] is working on healthy weight loss and exercise to improve blood pressure control. We will watch for signs of hypotension as she continues her lifestyle  modifications.  3. Diabetes mellitus type 2 in obese ([name] [name] will stay off glipizide for now.  Good blood sugar control is important to decrease the likelihood of diabetic complications such as nephropathy, neuropathy, limb loss, blindness, coronary artery disease, and death. Intensive lifestyle modification including diet, exercise and weight loss are the first line of treatment for diabetes.   4. Obesity, current BMI 41  [name] is currently in the action stage of change. As such, her goal is to continue with weight loss efforts. She has agreed to the Category 3 Plan.   [name] will work on meal planning, intentional eating, eliminating diet soft drinks, and strategies for the holidays.  Exercise goals:  Be more active.  Behavioral modification strategies: increasing lean protein intake, decreasing simple carbohydrates, increasing vegetables, increasing water intake, decreasing eating out, no skipping meals, meal planning and cooking strategies, keeping healthy foods in the home, and planning for success.  [name] has agreed to follow-up with our clinic in 4 weeks. She was informed of the importance of frequent follow-up visits to maximize her success with intensive lifestyle modifications for her multiple health conditions.   Objective:   Blood pressure 111/79, pulse 70, temperature 97.8 F (36.6 C), height 5\' 4"  (1.626 m), weight 285 lb (129.3 kg), SpO2 98 %. Body mass index is 48.92 kg/m.  General: Cooperative, alert, well developed, in no acute distress. HEENT: Conjunctivae and lids unremarkable. Cardiovascular: Regular rhythm.  Lungs: Normal work of breathing. Neurologic: No focal deficits.   Lab Results  Component Value Date   CREATININE 0.73 06/22/2021   BUN 11 06/22/2021   NA 141 06/22/2021   K 4.1 06/22/2021   CL 98 06/22/2021   CO2 26  06/22/2021   Lab Results  Component Value Date   ALT 23 06/22/2021   AST 20 06/22/2021   ALKPHOS 76 06/22/2021   BILITOT 0.6  06/22/2021   Lab Results  Component Value Date   HGBA1C 5.7 (H) 06/22/2021   Lab Results  Component Value Date   INSULIN 28.6 (H) 06/22/2021   Lab Results  Component Value Date   TSH 4.080 06/22/2021   Lab Results  Component Value Date   CHOL 142 06/22/2021   HDL 43 06/22/2021   LDLCALC 64 06/22/2021   TRIG 217 (H) 06/22/2021   Lab Results  Component Value Date   VD25OH 45.2 06/22/2021   Lab Results  Component Value Date   WBC 6.2 03/09/2016   HGB 15.0 12/13/2016   HCT 44.0 12/13/2016   MCV 90 03/09/2016   PLT 304 03/09/2016   Attestation Statements:   Reviewed by clinician on day of visit: allergies, medications, problem list, medical history, surgical history, family history, social history, and previous encounter notes.  I, Water quality scientist, CMA, am acting as Location manager for [employer], DO  I have reviewed the above documentation for accuracy and completeness, and I agree with the above. [name], DO

## 2021-11-25 ENCOUNTER — Encounter (INDEPENDENT_AMBULATORY_CARE_PROVIDER_SITE_OTHER): Payer: Self-pay

## 2021-11-25 ENCOUNTER — Ambulatory Visit (INDEPENDENT_AMBULATORY_CARE_PROVIDER_SITE_OTHER): Payer: Medicare Other | Admitting: Bariatrics

## 2022-01-03 ENCOUNTER — Telehealth: Payer: Self-pay

## 2022-01-03 NOTE — Telephone Encounter (Signed)
LMOM for patient to call back to schedule -  hm is full left message on cell

## 2022-01-12 ENCOUNTER — Ambulatory Visit (INDEPENDENT_AMBULATORY_CARE_PROVIDER_SITE_OTHER): Payer: Medicare Other | Admitting: Podiatry

## 2022-01-12 ENCOUNTER — Encounter: Payer: Self-pay | Admitting: Podiatry

## 2022-01-12 ENCOUNTER — Other Ambulatory Visit: Payer: Self-pay

## 2022-01-12 ENCOUNTER — Ambulatory Visit (INDEPENDENT_AMBULATORY_CARE_PROVIDER_SITE_OTHER): Payer: Medicare Other

## 2022-01-12 DIAGNOSIS — M722 Plantar fascial fibromatosis: Secondary | ICD-10-CM

## 2022-01-12 DIAGNOSIS — L97821 Non-pressure chronic ulcer of other part of left lower leg limited to breakdown of skin: Secondary | ICD-10-CM | POA: Diagnosis not present

## 2022-01-12 DIAGNOSIS — L03116 Cellulitis of left lower limb: Secondary | ICD-10-CM

## 2022-01-12 DIAGNOSIS — I83028 Varicose veins of left lower extremity with ulcer other part of lower leg: Secondary | ICD-10-CM | POA: Diagnosis not present

## 2022-01-12 MED ORDER — TRIAMCINOLONE ACETONIDE 40 MG/ML IJ SUSP
20.0000 mg | Freq: Once | INTRAMUSCULAR | Status: AC
  Administered 2022-01-12: 20 mg

## 2022-01-12 MED ORDER — CEPHALEXIN 500 MG PO CAPS: 500.0000 mg | ORAL_CAPSULE | Freq: Three times a day (TID) | ORAL | 0 refills | Status: DC

## 2022-01-12 NOTE — Progress Notes (Signed)
Subjective:  Patient ID: [name], female    DOB: Nov 04, 1956,  MRN: [id number] HPI Chief Complaint  Patient presents with   Foot Pain    Plantar heel left - aching x 2 weeks, no injury she remembers, but did notice a bruise in her arch, [facility] xrayed-put in boot, boot has rubbed a sore place on her leg   New Patient (Initial Visit)    Est pt 11/2018    66 y.o. female presents with the above complaint.   ROS: Denies fever chills nausea vomiting muscle aches pains calf pain back pain chest pain shortness of breath.  Past Medical History:  Diagnosis Date   Actinic keratosis    Allergy    Anxiety    Arthritis    Back pain    Displaced fracture of olecranon process without intraarticular extension of left ulna, initial encounter for closed fracture 12/13/2016   Edema of both lower extremities    Family history of anesthesia complication    PARENTS HAD NAUSEA   Fracture of radial head, left, closed 12/13/2016   GERD (gastroesophageal reflux disease)    prilosec once/week   H/O hiatal hernia    Hypertension    Joint pain    Obesity    Osteoarthritis of right knee 08/05/2014   Palpitations    Sleep apnea    cpap   SOB (shortness of breath)    Past Surgical History:  Procedure Laterality Date   ABDOMINAL HYSTERECTOMY     BREAST BIOPSY Right    neg/stereo   BREAST EXCISIONAL BIOPSY Right 2003   neg   BREAST SURGERY     breast biopsy - benign   CHOLECYSTECTOMY     COLONOSCOPY  multiple   ORIF ELBOW FRACTURE Left 12/13/2016   Procedure: OPEN REDUCTION INTERNAL FIXATION (ORIF) OLECRANON AND PROXIMAL RADIUS;  Surgeon: Marchia Bond, MD;  Location: [place];  Service: Orthopedics;  Laterality: Left;   PARTIAL KNEE ARTHROPLASTY Right 08/05/2014   DR Mardelle Matte   PARTIAL KNEE ARTHROPLASTY Right 08/05/2014   Procedure: RIGHT UNICOMPARTMENTAL KNEE;  Surgeon: Johnny Bridge, MD;  Location: [place];  Service: Orthopedics;  Laterality: Right;   TUBAL LIGATION      UPPER GASTROINTESTINAL ENDOSCOPY  10/2005   hiatal hernia, reflux   WRIST FRACTURE SURGERY  04/14/11   left Dr. Fredna Dow    Current Outpatient Medications:    cephALEXin (KEFLEX) 500 MG capsule, Take 1 capsule (500 mg total) by mouth 3 (three) times daily., Disp: 30 capsule, Rfl: 0   atenolol (TENORMIN) 100 MG tablet, Take 100 mg by mouth daily.  , Disp: , Rfl:    b complex vitamins capsule, Take 1 capsule by mouth daily., Disp: , Rfl:    Cholecalciferol (VITAMIN D3) 5000 UNITS CAPS, Take by mouth daily.  , Disp: , Rfl:    citalopram (CELEXA) 20 MG tablet, Take one and one half tablets (30 mg) daily., Disp: , Rfl:    fluticasone (FLONASE) 50 MCG/ACT nasal spray, Place 1 spray into both nostrils daily as needed for allergies or rhinitis., Disp: , Rfl:    furosemide (LASIX) 20 MG tablet, Take 1 tablet by mouth daily., Disp: , Rfl:    gabapentin (NEURONTIN) 300 MG capsule, , Disp: , Rfl:    glipiZIDE (GLUCOTROL) 10 MG tablet, Take 10 mg by mouth daily., Disp: , Rfl:    hydrALAZINE (APRESOLINE) 25 MG tablet, TAKE 1 TABLET BY MOUTH 3 TIMES A DAY AS NEEDED FOR BLOOD PRESSURE >150/90,  Disp: , Rfl: 3   lisinopril-hydrochlorothiazide (PRINZIDE,ZESTORETIC) 20-12.5 MG per tablet, Take 2 tablets by mouth daily., Disp: , Rfl:    magnesium oxide (MAG-OX) 400 MG tablet, Take 400 mg by mouth daily., Disp: , Rfl:    pravastatin (PRAVACHOL) 20 MG tablet, Take 20 tablets by mouth at bedtime., Disp: , Rfl:    VITAMIN A PO, Take 1 capsule by mouth daily., Disp: , Rfl:    vitamin C (ASCORBIC ACID) 500 MG tablet, Take 500 mg by mouth daily., Disp: , Rfl:   Allergies  Allergen Reactions   Wheat Rash    Also rash with oats   Codeine Itching   Metformin Diarrhea   Review of Systems Objective:  There were no vitals filed for this visit.  General: Well developed, nourished, in no acute distress, alert and oriented x3   Dermatological: Skin is warm, dry and supple bilateral. Nails x 10 are well maintained;  remaining integument appears unremarkable at this time. There are no open sores, no preulcerative lesions, no rash or signs of infection present.  Vascular: Dorsalis Pedis artery and Posterior Tibial artery pedal pulses are 2/4 bilateral with immedate capillary fill time. Pedal hair growth present. No varicosities and no lower extremity edema present bilateral.  Venous stasis dermatitis with red erythema and warmth left swollen leg small abrasion measuring about 2 cm secondary to boot from [place] [facility].  Neruologic: Grossly intact via light touch bilateral. Vibratory intact via tuning fork bilateral. Protective threshold with Semmes Wienstein monofilament intact to all pedal sites bilateral. Patellar and Achilles deep tendon reflexes 2+ bilateral. No Babinski or clonus noted bilateral.   Musculoskeletal: No gross boney pedal deformities bilateral. No pain, crepitus, or limitation noted with foot and ankle range of motion bilateral. Muscular strength 5/5 in all groups tested bilateral.  She has pain on palpation medial calcaneal tubercle of her left heel.  Gait: Unassisted, Nonantalgic.    Radiographs:  Radiographs taken today demonstrate a osseous mature individual considerable edema subcutaneous fat also does demonstrate a mild pes planus with a soft tissue increase in density plantar fascial Caney insertion site of the left heel.  She also demonstrates a small plantar distally oriented calcaneal spur.  Assessment & Plan:   Assessment: Cellulitis left leg with edema Planter fasciitis left foot  Plan: Discussed etiology pathology and surgical therapies Silvadene cream to the left leg today.  Wrote a prescription for an antibiotic Keflex 500 mg 1 p.o. 3 times daily.  Injected her left heel today 10 mg Kenalog 5 mg Marcaine point maximal tenderness.  Did not place any type of restriction device did recommend appropriate shoe gear.  I will follow-up with her in 2 to 3 weeks just to take a look  at that cellulitic process.     Deeann Servidio T. [place], [place]

## 2022-01-26 ENCOUNTER — Other Ambulatory Visit: Payer: Self-pay

## 2022-01-26 ENCOUNTER — Encounter: Payer: Self-pay | Admitting: Podiatry

## 2022-01-26 ENCOUNTER — Ambulatory Visit (INDEPENDENT_AMBULATORY_CARE_PROVIDER_SITE_OTHER): Payer: Medicare Other | Admitting: Podiatry

## 2022-01-26 DIAGNOSIS — M722 Plantar fascial fibromatosis: Secondary | ICD-10-CM

## 2022-01-26 DIAGNOSIS — L97821 Non-pressure chronic ulcer of other part of left lower leg limited to breakdown of skin: Secondary | ICD-10-CM

## 2022-01-26 DIAGNOSIS — I83028 Varicose veins of left lower extremity with ulcer other part of lower leg: Secondary | ICD-10-CM

## 2022-01-26 MED ORDER — TRIAMCINOLONE ACETONIDE 40 MG/ML IJ SUSP
20.0000 mg | Freq: Once | INTRAMUSCULAR | Status: AC
  Administered 2022-01-26: 20 mg

## 2022-01-27 NOTE — Progress Notes (Signed)
She presents today states that the cellulitis is doing much better.  She is wondering if she can get another injection for her left heel for the Planter fasciitis.  She is completed her antibiotics had no problems doing so denies fever chills nausea run muscle aches and pains. ? ?Objective: Vital signs are stable she is alert and oriented x3.  Left leg does not demonstrate any sign of cellulitis and the small wound has gone on to heal uneventfully.  She does have tenderness on palpation medial calcaneal tubercle of the left heel. ? ?Assessment: Residual planter fasciitis then mostly resolved.  Cellulitis has resolved. ? ?Plan: At this point I reinjected her left heel today 10 mg Kenalog 5 mg Marcaine point of maximal tenderness. ? ?Instructed her should her redness in her leg recurs she is to notify us immediately.  Otherwise I will see her in about a month ?

## 2022-02-28 ENCOUNTER — Ambulatory Visit: Payer: Medicare Other | Admitting: Podiatry

## 2022-03-11 LAB — COLOGUARD: COLOGUARD: NEGATIVE

## 2022-03-22 ENCOUNTER — Other Ambulatory Visit: Payer: Self-pay | Admitting: Family Medicine

## 2022-03-22 DIAGNOSIS — Z1231 Encounter for screening mammogram for malignant neoplasm of breast: Secondary | ICD-10-CM

## 2022-04-11 ENCOUNTER — Encounter: Payer: Self-pay | Admitting: Internal Medicine

## 2022-04-21 ENCOUNTER — Ambulatory Visit
Admission: RE | Admit: 2022-04-21 | Discharge: 2022-04-21 | Disposition: A | Discharge status: Home / Self Care | Payer: Medicare Other | Source: Ambulatory Visit | Attending: Family Medicine | Admitting: Family Medicine

## 2022-04-21 DIAGNOSIS — Z1231 Encounter for screening mammogram for malignant neoplasm of breast: Secondary | ICD-10-CM | POA: Diagnosis not present

## 2022-04-26 ENCOUNTER — Other Ambulatory Visit: Payer: Self-pay | Admitting: Family Medicine

## 2022-04-26 DIAGNOSIS — R928 Other abnormal and inconclusive findings on diagnostic imaging of breast: Secondary | ICD-10-CM

## 2022-04-26 DIAGNOSIS — N6489 Other specified disorders of breast: Secondary | ICD-10-CM

## 2022-05-02 ENCOUNTER — Ambulatory Visit
Admission: RE | Admit: 2022-05-02 | Discharge: 2022-05-02 | Disposition: A | Discharge status: Home / Self Care | Payer: Medicare Other | Source: Ambulatory Visit | Attending: Family Medicine | Admitting: Family Medicine

## 2022-05-02 DIAGNOSIS — N6489 Other specified disorders of breast: Secondary | ICD-10-CM

## 2022-05-02 DIAGNOSIS — R928 Other abnormal and inconclusive findings on diagnostic imaging of breast: Secondary | ICD-10-CM

## 2022-05-13 ENCOUNTER — Ambulatory Visit
Admission: RE | Admit: 2022-05-13 | Discharge: 2022-05-13 | Disposition: A | Discharge status: Home / Self Care | Payer: Medicare Other | Source: Ambulatory Visit | Attending: Emergency Medicine | Admitting: Emergency Medicine

## 2022-05-13 VITALS — BP 145/84 | HR 71 | Temp 98.8°F | Resp 16

## 2022-05-13 DIAGNOSIS — R051 Acute cough: Secondary | ICD-10-CM

## 2022-05-13 DIAGNOSIS — R300 Dysuria: Secondary | ICD-10-CM

## 2022-05-13 LAB — POCT URINALYSIS DIP (MANUAL ENTRY)
Bilirubin, UA: NEGATIVE
Glucose, UA: NEGATIVE mg/dL
Ketones, POC UA: NEGATIVE mg/dL
Nitrite, UA: NEGATIVE
Protein Ur, POC: NEGATIVE mg/dL
Spec Grav, UA: 1.015 (ref 1.010–1.025)
Urobilinogen, UA: 0.2 E.U./dL
pH, UA: 5 (ref 5.0–8.0)

## 2022-05-13 MED ORDER — GUAIFENESIN ER 600 MG PO TB12: 600.0000 mg | ORAL_TABLET | Freq: Two times a day (BID) | ORAL | 0 refills | Status: DC

## 2022-05-13 MED ORDER — CEPHALEXIN 500 MG PO CAPS: 500.0000 mg | ORAL_CAPSULE | Freq: Two times a day (BID) | ORAL | 0 refills | Status: AC

## 2022-05-13 MED ORDER — CLARITIN-D 24 HOUR 10-240 MG PO TB24: 1.0000 | ORAL_TABLET | Freq: Every day | ORAL | 1 refills | Status: DC

## 2022-05-13 NOTE — ED Provider Notes (Signed)
[facility]    CSN: [id number] Arrival date & time: 05/13/22  1120      History   Chief Complaint Chief Complaint  Patient presents with   Urinary Frequency    Persistent cough x 1 month - Entered by patient   Cough   Dysuria    HPI [name] is a 66 y.o. female.   Patient presents with a persistent nonproductive cough and generalized headaches for 1 month.  Symptoms initially began as a part of other viral infection but has continued past resolution of accompanying symptoms.  Was evaluated by her PCP on 04/26/2022, given Augmentin and prednisone which she endorses reduced symptoms but did not resolve them.  Has attempted use of additional over-the-counter medications which have been minimally helpful.  Denies shortness of breath, wheezing, chest pain or tightness.  Denies respiratory history.  Patient presents with dysuria and a urinary odor for 2 to 3 weeks.  Possible urinary frequency but unsure due to stress incontinence from coughing.  Has not attempted treatment of symptoms.  Denies hematuria, lower abdominal pain or pressure, flank pain, fevers, chills, vaginal discharge itching or odor.  Past Medical History:  Diagnosis Date   Actinic keratosis    Allergy    Anxiety    Arthritis    Back pain    Displaced fracture of olecranon process without intraarticular extension of left ulna, initial encounter for closed fracture 12/13/2016   Edema of both lower extremities    Family history of anesthesia complication    PARENTS HAD NAUSEA   Fracture of radial head, left, closed 12/13/2016   GERD (gastroesophageal reflux disease)    prilosec once/week   H/O hiatal hernia    Hypertension    Joint pain    Obesity    Osteoarthritis of right knee 08/05/2014   Palpitations    Sleep apnea    cpap   SOB (shortness of breath)     Patient Active Problem List   Diagnosis Date Noted   Type 2 diabetes mellitus with hyperlipidemia ([name] 06/23/2021   Encounter for  general adult medical examination without abnormal findings 04/08/2020   Rash 01/26/2020   OSA on CPAP 06/05/2019   Diabetic polyneuropathy associated with type 2 diabetes mellitus ([name] 01/30/2019   Reactive airway disease that is not asthma 08/08/2018   Displaced fracture of olecranon process without intraarticular extension of left ulna, initial encounter for closed fracture 12/13/2016   Fracture of radial head, left, closed 12/13/2016   Anxiety 12/23/2015   Borderline diabetes mellitus 12/23/2015   Back pain, chronic 12/23/2015   Benign essential HTN 12/23/2015   Obesity, diabetes, and hypertension syndrome ([name] 12/23/2015   Allergic rhinitis, seasonal 12/23/2015   Hypertriglyceridemia 04/29/2015   Osteoarthritis of right knee 08/05/2014   Knee osteoarthritis 08/05/2014   UTI (urinary tract infection) 02/01/2013   Eczema 11/15/2012   Personal history of failed conscious sedation 06/29/2011   GERD (gastroesophageal reflux disease) 06/29/2011   Morbid obesity ([name] 06/29/2011   Cellulitis and abscess 03/28/2011   BREAST CYST 05/26/2010   HYPERTENSION 02/12/2007   ARTHRITIS, BACK 02/09/2007    Past Surgical History:  Procedure Laterality Date   ABDOMINAL HYSTERECTOMY     BREAST BIOPSY Right    neg/stereo   BREAST EXCISIONAL BIOPSY Right 2003   neg   BREAST SURGERY     breast biopsy - benign   CHOLECYSTECTOMY     COLONOSCOPY  multiple   ORIF ELBOW FRACTURE Left 12/13/2016   Procedure:  OPEN REDUCTION INTERNAL FIXATION (ORIF) OLECRANON AND PROXIMAL RADIUS;  Surgeon: Teryl Lucy, MD;  Location: [facility];  Service: Orthopedics;  Laterality: Left;   PARTIAL KNEE ARTHROPLASTY Right 08/05/2014   DR Dion Saucier   PARTIAL KNEE ARTHROPLASTY Right 08/05/2014   Procedure: RIGHT UNICOMPARTMENTAL KNEE;  Surgeon: Eulas Post, MD;  Location: [facility];  Service: Orthopedics;  Laterality: Right;   TUBAL LIGATION     UPPER GASTROINTESTINAL ENDOSCOPY  10/2005   hiatal hernia,  reflux   WRIST FRACTURE SURGERY  04/14/11   left Dr. Merlyn Lot    OB History     Gravida  2   Para  2   Term      Preterm      AB      Living         SAB      IAB      Ectopic      Multiple      Live Births               Home Medications    Prior to Admission medications   Medication Sig Start Date End Date Taking? Authorizing Provider  cephALEXin (KEFLEX) 500 MG capsule Take 1 capsule (500 mg total) by mouth 2 (two) times daily for 5 days. 05/13/22 05/18/22 Yes Adonia Porada, Elita Boone, NP  guaiFENesin (MUCINEX) 600 MG 12 hr tablet Take 1 tablet (600 mg total) by mouth 2 (two) times daily. 05/13/22  Yes Blaze Nylund R, NP  loratadine-pseudoephedrine (CLARITIN-D 24 HOUR) 10-240 MG 24 hr tablet Take 1 tablet by mouth daily. 05/13/22  Yes Druanne Bosques R, NP  atenolol (TENORMIN) 100 MG tablet Take 100 mg by mouth daily.      [provider]  b complex vitamins capsule Take 1 capsule by mouth daily.    [provider]  Cholecalciferol (VITAMIN D3) 5000 UNITS CAPS Take by mouth daily.      [provider]  citalopram (CELEXA) 20 MG tablet Take one and one half tablets (30 mg) daily.    [provider]  fluticasone (FLONASE) 50 MCG/ACT nasal spray Place 1 spray into both nostrils daily as needed for allergies or rhinitis.    [provider]  furosemide (LASIX) 20 MG tablet Take 1 tablet by mouth daily. 12/22/11   [provider]  gabapentin (NEURONTIN) 300 MG capsule  01/20/18   [provider]  glipiZIDE (GLUCOTROL) 10 MG tablet Take 10 mg by mouth daily. 11/07/21   [provider]  hydrALAZINE (APRESOLINE) 25 MG tablet TAKE 1 TABLET BY MOUTH 3 TIMES A DAY AS NEEDED FOR BLOOD PRESSURE >150/90 09/19/18   [provider]  lisinopril-hydrochlorothiazide (PRINZIDE,ZESTORETIC) 20-12.5 MG per tablet Take 2 tablets by mouth daily.    [provider]  magnesium oxide (MAG-OX) 400 MG tablet Take 400  mg by mouth daily.    [provider]  pravastatin (PRAVACHOL) 20 MG tablet Take 20 tablets by mouth at bedtime. 06/07/21   [provider]  VITAMIN A PO Take 1 capsule by mouth daily.    [provider]  vitamin C (ASCORBIC ACID) 500 MG tablet Take 500 mg by mouth daily.    [provider]    Family History Family History  Problem Relation Age of Onset   Diabetes Mother    Hypertension Mother    Hyperlipidemia Mother    Heart disease Mother        Angioplasty   Stroke Mother  Arthritis Father    Heart disease Father        CABG   Hypertension Father    Hyperlipidemia Father    Asthma Daughter    Cancer Paternal Aunt        Colon   Rectal cancer Paternal Aunt    Cancer Paternal Uncle        Lung, smoker   Breast cancer Neg Hx     Social History Social History   Tobacco Use   Smoking status: Never   Smokeless tobacco: Never  Vaping Use   Vaping Use: Never used  Substance Use Topics   Alcohol use: No   Drug use: No     Allergies   Wheat, Codeine, and Metformin   Review of Systems Review of Systems Defer to HPI   Physical Exam Triage Vital Signs ED Triage Vitals  Enc Vitals Group     BP 05/13/22 1135 (!) 145/84     Pulse Rate 05/13/22 1135 71     Resp 05/13/22 1135 16     Temp 05/13/22 1135 98.8 F (37.1 C)     Temp Source 05/13/22 1135 Oral     SpO2 05/13/22 1135 95 %     Weight --      Height --      Head Circumference --      Peak Flow --      Pain Score 05/13/22 1145 2     Pain Loc --      Pain Edu? --      Excl. in GC? --    No data found.  Updated Vital Signs BP (!) 145/84 (BP Location: Left Arm)   Pulse 71   Temp 98.8 F (37.1 C) (Oral)   Resp 16   SpO2 95%   Visual Acuity Right Eye Distance:   Left Eye Distance:   Bilateral Distance:    Right Eye Near:   Left Eye Near:    Bilateral Near:     Physical Exam Constitutional:      Appearance: Normal appearance.  HENT:     Head:  Normocephalic.     Right Ear: Tympanic membrane, ear canal and external ear normal.     Left Ear: Tympanic membrane, ear canal and external ear normal.     Nose: Congestion present. No rhinorrhea.     Mouth/Throat:     Mouth: Mucous membranes are moist.     Pharynx: Oropharynx is clear.  Eyes:     Extraocular Movements: Extraocular movements intact.  Cardiovascular:     Rate and Rhythm: Normal rate and regular rhythm.     Pulses: Normal pulses.     Heart sounds: Normal heart sounds.  Pulmonary:     Effort: Pulmonary effort is normal.     Breath sounds: Normal breath sounds.  Abdominal:     General: Abdomen is flat. Bowel sounds are normal.     Palpations: Abdomen is soft.  Musculoskeletal:     Cervical back: Normal range of motion and neck supple.  Skin:    General: Skin is warm and dry.  Neurological:     Mental Status: She is alert and oriented to person, place, and time. Mental status is at baseline.  Psychiatric:        Mood and Affect: Mood normal.        Behavior: Behavior normal.      UC Treatments / Results  Labs (all labs ordered are listed, but only abnormal results are displayed)  Labs Reviewed  POCT URINALYSIS DIP (MANUAL ENTRY) - Abnormal; Notable for the following components:      Result Value   Clarity, UA cloudy (*)    Blood, UA trace-lysed (*)    Leukocytes, UA Large (3+) (*)    All other components within normal limits  URINE CULTURE  CERVICOVAGINAL ANCILLARY ONLY    EKG   Radiology No results found.  Procedures Procedures (including critical care time)  Medications Ordered in UC Medications - No data to display  Initial Impression / Assessment and Plan / UC Course  I have reviewed the triage vital signs and the nursing notes.  Pertinent labs & imaging results that were available during my care of the patient were reviewed by me and considered in my medical decision making (see chart for details).  Dysuria Acute cough  Urinalysis  showing Cypress Fanfan blood cells but negative for nitrates, sent for culture, discussed with patient, symptoms have been present 2 to 3 weeks we will begin prophylactic coverage, Keflex 5-day course prescribed, vaginal swab checking for BV and yeast pending, will treat per protocol, may follow-up with urgent care or PCP if symptoms continue to persist or worsen  Vital signs are stable, O2 saturation 95% on room air, lungs are clear to auscultation, low suspicion for pneumonia, bronchitis or pneumothorax, as patient has recently completed antibiotic and prednisone course, low suspicion of respiratory involvement and exacerbation of cough, most likely related to congestion, discussed with patient, advised beginning use of Flonase which patient endorses that she has at home, discontinued Zyrtec and will begin use of Claritin-D as well as Mucinex, may continue use of over-the-counter cough medicine for additional support, may follow-up with this urgent care or PCP if symptoms continue to persist Final Clinical Impressions(s) / UC Diagnoses   Final diagnoses:  Dysuria  Acute cough     Discharge Instructions      For your cough  -On exam your lungs are clear and you are getting enough area and without any assistance as you have recently completed an antibiotic and steroids I do not believe the issue today to be related to your respiratory system and is mostly being caused by congestion therefore that will be the focus of treatment -Begin use of Flonase every morning and every evening to reduce swelling to the nasal cavity and clear sinuses -Stop use of Zyrtec and begin use of Claritin-D every morning which will help minimize secretions as well as reduce congestion -Begin use of Mucinex every morning and every evening to help thin secretions and allow them to drain -You may continue use of current cough medicine for additional comfort -Ensure that your bedroom is not warm and stuffy at nighttime as this may  worsen your cough, you may use a humidifier overnight if you have 1, if you do not you may steam your bathroom and sit inside for 5 to 10 minutes to moisten the airways prior to bed -ensure that you are drinking enough fluid to help thin out secretions further   For your bladder  -Your urinalysis showed Camyah Pultz blood cells but did not show nitrates which is an enzyme released from bacteria therefore it has been sent to the lab to determine if and what bacteria is present -Symptoms have been present for 2 to 3 weeks we will begin bacterial coverage, take Keflex every morning and every evening for the next 5 days -Vaginal swab to check for yeast and bacterial vaginosis has been obtained and is currently pending,  you will be notified of positive results only and medication sent in at time of notification   You may follow-up with urgent care or your primary doctor if symptoms continue to persist or worsen   ED Prescriptions     Medication Sig Dispense Auth. Provider   cephALEXin (KEFLEX) 500 MG capsule Take 1 capsule (500 mg total) by mouth 2 (two) times daily for 5 days. 10 capsule Dametria Tuzzolino R, NP   loratadine-pseudoephedrine (CLARITIN-D 24 HOUR) 10-240 MG 24 hr tablet Take 1 tablet by mouth daily. 30 tablet Corda Shutt R, NP   guaiFENesin (MUCINEX) 600 MG 12 hr tablet Take 1 tablet (600 mg total) by mouth 2 (two) times daily. 30 tablet Valinda Hoar, NP      PDMP not reviewed this encounter.   Valinda Hoar, [place] 05/13/22 1218

## 2022-05-15 LAB — URINE CULTURE: Culture: >=100000 — AB

## 2022-05-16 LAB — CERVICOVAGINAL ANCILLARY ONLY
Bacterial Vaginitis (gardnerella): NEGATIVE
Candida Glabrata: NEGATIVE
Candida Vaginitis: NEGATIVE
Comment: NEGATIVE
Comment: NEGATIVE
Comment: NEGATIVE

## 2022-06-17 ENCOUNTER — Telehealth: Payer: Self-pay | Admitting: Internal Medicine

## 2022-06-17 NOTE — Telephone Encounter (Signed)
Please see note below. 

## 2022-06-17 NOTE — Telephone Encounter (Signed)
Patient received her letter for recall colonoscopy.  She called the office to let Dr. Carlean Purl know that she did a Cologuard test a couple months ago and it came back negative.  She is going to have her PCP forward the results to Dr. Carlean Purl.  If he feels she still needs to do the colonoscopy, please reach out to patient and let her know.  Thank you.

## 2022-06-20 NOTE — Telephone Encounter (Signed)
I see that she switched to Cologuard and it was negative.  That is acceptable.  She can continue to follow-up through primary care.  We can remove any recalls we have.

## 2022-06-21 NOTE — Telephone Encounter (Signed)
Pt made aware of Dr. Carlean Purl recommendations:  Recall removed: Pt made aware Pt verbalized understanding with all questions answered.

## 2022-06-29 ENCOUNTER — Encounter (INDEPENDENT_AMBULATORY_CARE_PROVIDER_SITE_OTHER): Payer: Self-pay

## 2022-11-24 DIAGNOSIS — I483 Typical atrial flutter: Secondary | ICD-10-CM | POA: Insufficient documentation

## 2022-12-01 ENCOUNTER — Encounter: Payer: Self-pay | Admitting: Cardiology

## 2022-12-01 ENCOUNTER — Other Ambulatory Visit: Payer: Self-pay | Admitting: Cardiology

## 2022-12-01 ENCOUNTER — Ambulatory Visit: Payer: Medicare Other | Attending: Cardiology | Admitting: Cardiology

## 2022-12-01 VITALS — BP 132/60 | HR 101 | Ht 64.0 in | Wt 314.2 lb

## 2022-12-01 DIAGNOSIS — I483 Typical atrial flutter: Secondary | ICD-10-CM | POA: Diagnosis present

## 2022-12-01 MED ORDER — APIXABAN 5 MG PO TABS: 5.0000 mg | ORAL_TABLET | Freq: Two times a day (BID) | ORAL | 6 refills | Status: DC

## 2022-12-01 NOTE — H&P (View-Only) (Signed)
Cardiology Office Note:    Date:  12/01/2022   ID:  [name], DOB 1956/05/27, MRN [id number]  PCP:  Dion Body, MD   [place] Providers Cardiologist:  None     Referring MD: Dion Body, MD    History of Present Illness:    [name] is a 67 y.o. female here for evaluation of atrial flutter at the request of Dr.Kanhka.  Has diabetes hypertension hyperlipidemia and newly discovered atrial flutter.  No prior stroke history.  No prior vascular history.  Has never had any prior cardiac issues.  Her symptoms of atrial flutter are new with intermittent symptoms. Just before 11/14/22 just before went to bed felt something in chest and she was SOB that is increasing. Pulse ox at home did not detect. Even in summer was short of breath.  No chest pain no dizziness.  She was started on Cardizem 120 mg long-acting as well as Eliquis 5 mg twice a day.  It looks as though Coumadin is now on her medication list instead of Eliquis Cost issue, currently taking Coumadin 5 mg daily 7.5 M and F.  He is currently on atenolol 100 mg as well.  Told to avoid caffeine.  Holter monitor and echo was ordered.  EKG personally reviewed shows what looks like typical flutter pattern.  Heart rate was 131 bpm.  Father had MI in his 62's  Non smoker  Used to work with Dr. Renford Dills.   Past Medical History:  Diagnosis Date   Actinic keratosis    Allergy    Anxiety    Arthritis    Back pain    Displaced fracture of olecranon process without intraarticular extension of left ulna, initial encounter for closed fracture 12/13/2016   Edema of both lower extremities    Family history of anesthesia complication    PARENTS HAD NAUSEA   Fracture of radial head, left, closed 12/13/2016   GERD (gastroesophageal reflux disease)    prilosec once/week   H/O hiatal hernia    Hypertension    Joint pain    Obesity    Osteoarthritis of right knee 08/05/2014   Palpitations    Sleep  apnea    cpap   SOB (shortness of breath)     Past Surgical History:  Procedure Laterality Date   ABDOMINAL HYSTERECTOMY     BREAST BIOPSY Right    neg/stereo   BREAST EXCISIONAL BIOPSY Right 2003   neg   BREAST SURGERY     breast biopsy - benign   CHOLECYSTECTOMY     COLONOSCOPY  multiple   ORIF ELBOW FRACTURE Left 12/13/2016   Procedure: OPEN REDUCTION INTERNAL FIXATION (ORIF) OLECRANON AND PROXIMAL RADIUS;  Surgeon: Marchia Bond, MD;  Location: [facility];  Service: Orthopedics;  Laterality: Left;   PARTIAL KNEE ARTHROPLASTY Right 08/05/2014   DR Mardelle Matte   PARTIAL KNEE ARTHROPLASTY Right 08/05/2014   Procedure: RIGHT UNICOMPARTMENTAL KNEE;  Surgeon: Johnny Bridge, MD;  Location: [facility];  Service: Orthopedics;  Laterality: Right;   TUBAL LIGATION     UPPER GASTROINTESTINAL ENDOSCOPY  10/2005   hiatal hernia, reflux   WRIST FRACTURE SURGERY  04/14/11   left Dr. Fredna Dow    Current Medications: Current Meds  Medication Sig   apixaban (ELIQUIS) 5 MG TABS tablet Take 1 tablet (5 mg total) by mouth 2 (two) times daily.   atenolol (TENORMIN) 100 MG tablet Take 100 mg by mouth daily.     b  complex vitamins capsule Take 1 capsule by mouth daily.   Cholecalciferol (VITAMIN D3) 5000 UNITS CAPS Take by mouth daily.     citalopram (CELEXA) 20 MG tablet Take one and one half tablets (30 mg) daily.   diltiazem (CARDIZEM CD) 120 MG 24 hr capsule Take 1 capsule by mouth daily.   fluticasone (FLONASE) 50 MCG/ACT nasal spray Place 1 spray into both nostrils daily as needed for allergies or rhinitis.   gabapentin (NEURONTIN) 300 MG capsule Take 600 mg by mouth at bedtime.   glipiZIDE (GLUCOTROL) 10 MG tablet Take 10 mg by mouth 2 (two) times daily before a meal.   hydrALAZINE (APRESOLINE) 25 MG tablet TAKE 1 TABLET BY MOUTH 3 TIMES A DAY AS NEEDED FOR BLOOD PRESSURE >150/90   lisinopril-hydrochlorothiazide (PRINZIDE,ZESTORETIC) 20-12.5 MG per tablet Take 2 tablets by mouth daily.    magnesium oxide (MAG-OX) 400 MG tablet Take 400 mg by mouth daily.   pravastatin (PRAVACHOL) 20 MG tablet Take 20 tablets by mouth at bedtime.   VITAMIN A PO Take 1 capsule by mouth daily.   vitamin C (ASCORBIC ACID) 500 MG tablet Take 500 mg by mouth daily.   [DISCONTINUED] warfarin (COUMADIN) 5 MG tablet Take 5 mg by mouth daily in the afternoon.     Allergies:   Wheat, Codeine, and Metformin   Social History   Socioeconomic History   Marital status: Married    Spouse name: Gle   Number of children: 2   Years of education: Not on file   Highest education level: Not on file  Occupational History   Occupation: CMA, Catering manager, [place], Retired    Fish farm manager: Therapist, music  Tobacco Use   Smoking status: Never   Smokeless tobacco: Never  Vaping Use   Vaping Use: Never used  Substance and Sexual Activity   Alcohol use: No   Drug use: No   Sexual activity: Not on file  Other Topics Concern   Not on file  Social History Narrative   Regular Exercise:  Yes, elliptical   Diet:  Mostly fruits and veggies, some meat.   Enjoys:  Firefighter, crocheting, sewing, playing with grandchildren.   Social Determinants of Health   Financial Resource Strain: Not on file  Food Insecurity: Not on file  Transportation Needs: Not on file  Physical Activity: Not on file  Stress: Not on file  Social Connections: Not on file     Family History: The patient's family history includes Arthritis in her father; Asthma in her daughter; Cancer in her paternal aunt and paternal uncle; Diabetes in her mother; Heart disease in her father and mother; Hyperlipidemia in her father and mother; Hypertension in her father and mother; Rectal cancer in her paternal aunt; Stroke in her mother. There is no history of Breast cancer.  ROS:   Please see the history of present illness.    No fevers chills nausea vomiting syncope bleeding.  All other systems reviewed and are negative.  EKGs/Labs/Other  Studies Reviewed:    The following studies were reviewed today: Prior office notes reviewed  EKG:  The ekg ordered today demonstrates  EKG 12/01/2022-atrial flutter 101 bpm with variable conduction, typical pattern EKG 11/24/2022-atrial flutter 131 2-1 conduction typical pattern.  Recent Labs: No results found for requested labs within last 365 days.  Recent Lipid Panel    Component Value Date/Time   CHOL 142 06/22/2021 0913   TRIG 217 (H) 06/22/2021 0913   HDL 43 06/22/2021 0913   LDLCALC 64  06/22/2021 0913     Risk Assessment/Calculations:    CHA2DS2-VASc Score = 4   This indicates a 4.8% annual risk of stroke. The patient's score is based upon: CHF History: 0 HTN History: 1 Diabetes History: 1 Stroke History: 0 Vascular Disease History: 0 Age Score: 1 Gender Score: 1               Physical Exam:    VS:  BP 132/60   Pulse (!) 101   Ht '5\' 4"'$  (1.626 m)   Wt (!) 314 lb 3.2 oz (142.5 kg)   SpO2 94%   BMI 53.93 kg/m     Wt Readings from Last 3 Encounters:  12/01/22 (!) 314 lb 3.2 oz (142.5 kg)  10/12/21 285 lb (129.3 kg)  09/06/21 285 lb (129.3 kg)     GEN:  Well nourished, well developed in no acute distress HEENT: Normal NECK: No JVD; No carotid bruits LYMPHATICS: No lymphadenopathy CARDIAC: Irregularly irregular, no murmurs, rubs, gallops RESPIRATORY:  Clear to auscultation without rales, wheezing or rhonchi  ABDOMEN: Soft, non-tender, non-distended MUSCULOSKELETAL:  No edema; No deformity  SKIN: Warm and dry NEUROLOGIC:  Alert and oriented x 3 PSYCHIATRIC:  Normal affect   ASSESSMENT:    1. Typical atrial flutter ([name]    PLAN:    In order of problems listed above:  Typical atrial flutter - Noted on ECG on 11/24/2022 at 131 bpm with typical pattern, sawtooth.  Negative deflection in leads II, III, aVF with positive deflection in V1. - Cardizem CD 120 mg was added to her atenolol 100 mg daily. -She has been progressively short of breath.   Could be a component of diastolic heart failure secondary to atrial fibrillation or perhaps tachycardia induced cardiomyopathy.  TEE will be helpful. - We will go ahead and set her up for TEE cardioversion next week.  Risks and benefits have been explained.  Willing to proceed.  Husband present for discussion as well.  If we must, we will perform a chest wall echocardiogram here. Orders are in.   Chronic anticoagulation - Currently on warfarin 5 mg a day as opposed to Eliquis 5 mg twice a day.  Cost must have been an issue. - We will go ahead and stop her Coumadin and place her back on Eliquis and get her into patient assistance.  Her last INR was 1.2 after starting the Coumadin.  We will stop the Coumadin.  She does understand that she will need to meet a deductible first and then after she meets the deductible the Eliquis will be less expensive until donut hole.  Diabetes/hypertension/hyperlipidemia/morbid obesity - Per primary team.  Elevated triglycerides seen in the setting of hemoglobin A1c 7.2. -Continue to modify diet, weight loss.  Obesity is certainly a risk factor for atrial flutter.       Shared Decision Making/Informed Consent The risks [stroke, cardiac arrhythmias rarely resulting in the need for a temporary or permanent pacemaker, skin irritation or burns, esophageal damage, perforation (1:10,000 risk), bleeding, pharyngeal hematoma as well as other potential complications associated with conscious sedation including aspiration, arrhythmia, respiratory failure and death], benefits (treatment guidance, restoration of normal sinus rhythm, diagnostic support) and alternatives of a transesophageal echocardiogram guided cardioversion were discussed in detail with Ms. Lipsky and she is willing to proceed.    Medication Adjustments/Labs and Tests Ordered: Current medicines are reviewed at length with the patient today.  Concerns regarding medicines are outlined above.  Orders Placed This  Encounter  Procedures  EKG 12-Lead   Meds ordered this encounter  Medications   apixaban (ELIQUIS) 5 MG TABS tablet    Sig: Take 1 tablet (5 mg total) by mouth 2 (two) times daily.    Dispense:  60 tablet    Refill:  6    Patient Instructions  Medication Instructions:  Please discontinue your Coumadin and start Eliquis 5 mg twice a day. Continue all other medications as listed.  *If you need a refill on your cardiac medications before your next appointment, please call your pharmacy*  Lab Work: None today. If you have labs (blood work) drawn today and your tests are completely normal, you will receive your results only by: [place] (if you have MyChart) OR A paper copy in the mail If you have any lab test that is abnormal or we need to change your treatment, we will call you to review the results.  Testing/Procedures: Your physician has requested that you have a TEE/Cardioversion. During a TEE, sound waves are used to create images of your heart. It provides your doctor with information about the size and shape of your heart and how well your heart's chambers and valves are working. In this test, a transducer is attached to the end of a flexible tube that is guided down you throat and into your esophagus (the tube leading from your mouth to your stomach) to get a more detailed image of your heart. Once the TEE has determined that a blood clot is not present, the cardioversion begins. Electrical Cardioversion uses a jolt of electricity to your heart either through paddles or wired patches attached to your chest. This is a controlled, usually prescheduled, procedure. This procedure is done at the hospital and you are not awake during the procedure. You usually go home the day of the procedure. Please see the instruction sheet given to you today for more information.    [place] A DEPT OF [facility] A [facility] OF [facility]  [facility] [place], [place] [id number] [place] [address] Dept: [phone] Loc: [place]  12/01/2022  You are scheduled for a TEE/cardioversion on Thursday, January 18 with Dr.  [name] .  1. Please arrive at the [facility] (Main Entrance A) at [facility]: [address] [place], [place] [address] at 11:00 [place] parking service is available.   Special note: Every effort is made to have your procedure done on time. Please understand that emergencies sometimes delay scheduled procedures.  2. Diet: Do not eat or drink anything after midnight prior to your procedure except sips of water to take medications.  3. Labs: None needed.  4. Medication instructions in preparation for your procedure:  Do not take your Glipizide the morning of your procedure.  On the morning of your procedure, take your  Eliquis  and any other morning medications.  You may use sips of water.  6. Bring a current list of your medications and current insurance cards. 7. You MUST have a responsible person to drive you home. 8. Someone MUST be with you the first 24 hours after you arrive home or your discharge will be delayed. 9. Please wear clothes that are easy to get on and off and wear slip-on shoes.  Thank you for allowing [place] to care for you!   -- [place] [facility]   Follow-Up: At [facility], you and  your health needs are our priority.  As part of our continuing mission to provide you with exceptional heart care, we have created designated Provider Care Teams.  These Care Teams include your primary Cardiologist (physician) and Advanced Practice Providers (APPs -  Physician Assistants and Nurse Practitioners) who all work together to provide you with the care you need, when you need it.  We recommend signing up for the patient portal called "MyChart".  Sign up information is provided on this After  Visit Summary.  MyChart is used to connect with patients for Virtual Visits (Telemedicine).  Patients are able to view lab/test results, encounter notes, upcoming appointments, etc.  Non-urgent messages can be sent to your provider as well.   To learn more about what you can do with MyChart, go to [email or web address].    Your next appointment:   4 week(s) (after procedure)  Provider:   Nicholes Rough, PA-C, Ambrose Pancoast, NP, Ermalinda Barrios, PA-C, Christen Bame, NP, or Richardson Dopp, PA-C            Signed, Candee Furbish, MD  12/01/2022 12:34 PM    [place]

## 2022-12-01 NOTE — Patient Instructions (Signed)
Medication Instructions:  Please discontinue your Coumadin and start Eliquis 5 mg twice a day. Continue all other medications as listed.  *If you need a refill on your cardiac medications before your next appointment, please call your pharmacy*  Lab Work: None today. If you have labs (blood work) drawn today and your tests are completely normal, you will receive your results only by: [place] (if you have MyChart) OR A paper copy in the mail If you have any lab test that is abnormal or we need to change your treatment, we will call you to review the results.  Testing/Procedures: Your physician has requested that you have a TEE/Cardioversion. During a TEE, sound waves are used to create images of your heart. It provides your doctor with information about the size and shape of your heart and how well your heart's chambers and valves are working. In this test, a transducer is attached to the end of a flexible tube that is guided down you throat and into your esophagus (the tube leading from your mouth to your stomach) to get a more detailed image of your heart. Once the TEE has determined that a blood clot is not present, the cardioversion begins. Electrical Cardioversion uses a jolt of electricity to your heart either through paddles or wired patches attached to your chest. This is a controlled, usually prescheduled, procedure. This procedure is done at the hospital and you are not awake during the procedure. You usually go home the day of the procedure. Please see the instruction sheet given to you today for more information.    [place] A DE[facility] OF [place] [facility] [place], [place] [id number] [place] [address] Dept: [phone] Loc: [place]  12/01/2022  You are scheduled for a TEE/cardioversion on Thursday, January 18 with Dr.  [name]  .  1. Please arrive at the [facility] (Main Entrance A) at [facility]: [address] [place], [place] [address] at 11:00 [place] parking service is available.   Special note: Every effort is made to have your procedure done on time. Please understand that emergencies sometimes delay scheduled procedures.  2. Diet: Do not eat or drink anything after midnight prior to your procedure except sips of water to take medications.  3. Labs: None needed.  4. Medication instructions in preparation for your procedure:  Do not take your Glipizide the morning of your procedure.  On the morning of your procedure, take your  Eliquis  and any other morning medications.  You may use sips of water.  6. Bring a current list of your medications and current insurance cards. 7. You MUST have a responsible person to drive you home. 8. Someone MUST be with you the first 24 hours after you arrive home or your discharge will be delayed. 9. Please wear clothes that are easy to get on and off and wear slip-on shoes.  Thank you for allowing [place] to care for you!   -- [place] [facility]   Follow-Up: At [facility], you and your health needs are our priority.  As part of our continuing mission to provide you with exceptional heart care, we have created designated Provider Care Teams.  These Care Teams include your primary Cardiologist (physician) and Advanced Practice Providers (APPs -  Physician Assistants and Nurse Practitioners) who all work together to  provide you with the care you need, when you need it.  We recommend signing up for the patient portal called "MyChart".  Sign up information is provided on this After Visit Summary.  MyChart is used to connect with patients for Virtual Visits (Telemedicine).  Patients are able to view lab/test results, encounter notes, upcoming appointments, etc.  Non-urgent messages can be sent to your provider as well.   To learn  more about what you can do with MyChart, go to [email or web address].    Your next appointment:   4 week(s) (after procedure)  Provider:   Nicholes Rough, PA-C, Ambrose Pancoast, NP, Ermalinda Barrios, PA-C, Christen Bame, NP, or Richardson Dopp, PA-C

## 2022-12-01 NOTE — Progress Notes (Signed)
Cardiology Office Note:    Date:  12/01/2022   ID:  [name], DOB 1956/10/14, MRN [id number]  PCP:  Dion Body, MD   [place] Providers Cardiologist:  None     Referring MD: Dion Body, MD    History of Present Illness:    [name] is a 67 y.o. female here for evaluation of atrial flutter at the request of Dr.Kanhka.  Has diabetes hypertension hyperlipidemia and newly discovered atrial flutter.  No prior stroke history.  No prior vascular history.  Has never had any prior cardiac issues.  Her symptoms of atrial flutter are new with intermittent symptoms. Just before 11/14/22 just before went to bed felt something in chest and she was SOB that is increasing. Pulse ox at home did not detect. Even in summer was short of breath.  No chest pain no dizziness.  She was started on Cardizem 120 mg long-acting as well as Eliquis 5 mg twice a day.  It looks as though Coumadin is now on her medication list instead of Eliquis Cost issue, currently taking Coumadin 5 mg daily 7.5 M and F.  He is currently on atenolol 100 mg as well.  Told to avoid caffeine.  Holter monitor and echo was ordered.  EKG personally reviewed shows what looks like typical flutter pattern.  Heart rate was 131 bpm.  Father had MI in his 21's  Non smoker  Used to work with Dr. Renford Dills.   Past Medical History:  Diagnosis Date   Actinic keratosis    Allergy    Anxiety    Arthritis    Back pain    Displaced fracture of olecranon process without intraarticular extension of left ulna, initial encounter for closed fracture 12/13/2016   Edema of both lower extremities    Family history of anesthesia complication    PARENTS HAD NAUSEA   Fracture of radial head, left, closed 12/13/2016   GERD (gastroesophageal reflux disease)    prilosec once/week   H/O hiatal hernia    Hypertension    Joint pain    Obesity    Osteoarthritis of right knee 08/05/2014   Palpitations    Sleep  apnea    cpap   SOB (shortness of breath)     Past Surgical History:  Procedure Laterality Date   ABDOMINAL HYSTERECTOMY     BREAST BIOPSY Right    neg/stereo   BREAST EXCISIONAL BIOPSY Right 2003   neg   BREAST SURGERY     breast biopsy - benign   CHOLECYSTECTOMY     COLONOSCOPY  multiple   ORIF ELBOW FRACTURE Left 12/13/2016   Procedure: OPEN REDUCTION INTERNAL FIXATION (ORIF) OLECRANON AND PROXIMAL RADIUS;  Surgeon: Marchia Bond, MD;  Location: [facility];  Service: Orthopedics;  Laterality: Left;   PARTIAL KNEE ARTHROPLASTY Right 08/05/2014   DR Mardelle Matte   PARTIAL KNEE ARTHROPLASTY Right 08/05/2014   Procedure: RIGHT UNICOMPARTMENTAL KNEE;  Surgeon: Johnny Bridge, MD;  Location: [facility];  Service: Orthopedics;  Laterality: Right;   TUBAL LIGATION     UPPER GASTROINTESTINAL ENDOSCOPY  10/2005   hiatal hernia, reflux   WRIST FRACTURE SURGERY  04/14/11   left Dr. Fredna Dow    Current Medications: Current Meds  Medication Sig   apixaban (ELIQUIS) 5 MG TABS tablet Take 1 tablet (5 mg total) by mouth 2 (two) times daily.   atenolol (TENORMIN) 100 MG tablet Take 100 mg by mouth daily.     b  complex vitamins capsule Take 1 capsule by mouth daily.   Cholecalciferol (VITAMIN D3) 5000 UNITS CAPS Take by mouth daily.     citalopram (CELEXA) 20 MG tablet Take one and one half tablets (30 mg) daily.   diltiazem (CARDIZEM CD) 120 MG 24 hr capsule Take 1 capsule by mouth daily.   fluticasone (FLONASE) 50 MCG/ACT nasal spray Place 1 spray into both nostrils daily as needed for allergies or rhinitis.   gabapentin (NEURONTIN) 300 MG capsule Take 600 mg by mouth at bedtime.   glipiZIDE (GLUCOTROL) 10 MG tablet Take 10 mg by mouth 2 (two) times daily before a meal.   hydrALAZINE (APRESOLINE) 25 MG tablet TAKE 1 TABLET BY MOUTH 3 TIMES A DAY AS NEEDED FOR BLOOD PRESSURE >150/90   lisinopril-hydrochlorothiazide (PRINZIDE,ZESTORETIC) 20-12.5 MG per tablet Take 2 tablets by mouth daily.    magnesium oxide (MAG-OX) 400 MG tablet Take 400 mg by mouth daily.   pravastatin (PRAVACHOL) 20 MG tablet Take 20 tablets by mouth at bedtime.   VITAMIN A PO Take 1 capsule by mouth daily.   vitamin C (ASCORBIC ACID) 500 MG tablet Take 500 mg by mouth daily.   [DISCONTINUED] warfarin (COUMADIN) 5 MG tablet Take 5 mg by mouth daily in the afternoon.     Allergies:   Wheat, Codeine, and Metformin   Social History   Socioeconomic History   Marital status: Married    Spouse name: Gle   Number of children: 2   Years of education: Not on file   Highest education level: Not on file  Occupational History   Occupation: CMA, Catering manager, [employer], Retired    Fish farm manager: Therapist, music  Tobacco Use   Smoking status: Never   Smokeless tobacco: Never  Vaping Use   Vaping Use: Never used  Substance and Sexual Activity   Alcohol use: No   Drug use: No   Sexual activity: Not on file  Other Topics Concern   Not on file  Social History Narrative   Regular Exercise:  Yes, elliptical   Diet:  Mostly fruits and veggies, some meat.   Enjoys:  Firefighter, crocheting, sewing, playing with grandchildren.   Social Determinants of Health   Financial Resource Strain: Not on file  Food Insecurity: Not on file  Transportation Needs: Not on file  Physical Activity: Not on file  Stress: Not on file  Social Connections: Not on file     Family History: The patient's family history includes Arthritis in her father; Asthma in her daughter; Cancer in her paternal aunt and paternal uncle; Diabetes in her mother; Heart disease in her father and mother; Hyperlipidemia in her father and mother; Hypertension in her father and mother; Rectal cancer in her paternal aunt; Stroke in her mother. There is no history of Breast cancer.  ROS:   Please see the history of present illness.    No fevers chills nausea vomiting syncope bleeding.  All other systems reviewed and are negative.  EKGs/Labs/Other  Studies Reviewed:    The following studies were reviewed today: Prior office notes reviewed  EKG:  The ekg ordered today demonstrates  EKG 12/01/2022-atrial flutter 101 bpm with variable conduction, typical pattern EKG 11/24/2022-atrial flutter 131 2-1 conduction typical pattern.  Recent Labs: No results found for requested labs within last 365 days.  Recent Lipid Panel    Component Value Date/Time   CHOL 142 06/22/2021 0913   TRIG 217 (H) 06/22/2021 0913   HDL 43 06/22/2021 0913   LDLCALC 64  06/22/2021 0913     Risk Assessment/Calculations:    CHA2DS2-VASc Score = 4   This indicates a 4.8% annual risk of stroke. The patient's score is based upon: CHF History: 0 HTN History: 1 Diabetes History: 1 Stroke History: 0 Vascular Disease History: 0 Age Score: 1 Gender Score: 1               Physical Exam:    VS:  BP 132/60   Pulse (!) 101   Ht '5\' 4"'$  (1.626 m)   Wt (!) 314 lb 3.2 oz (142.5 kg)   SpO2 94%   BMI 53.93 kg/m     Wt Readings from Last 3 Encounters:  12/01/22 (!) 314 lb 3.2 oz (142.5 kg)  10/12/21 285 lb (129.3 kg)  09/06/21 285 lb (129.3 kg)     GEN:  Well nourished, well developed in no acute distress HEENT: Normal NECK: No JVD; No carotid bruits LYMPHATICS: No lymphadenopathy CARDIAC: Irregularly irregular, no murmurs, rubs, gallops RESPIRATORY:  Clear to auscultation without rales, wheezing or rhonchi  ABDOMEN: Soft, non-tender, non-distended MUSCULOSKELETAL:  No edema; No deformity  SKIN: Warm and dry NEUROLOGIC:  Alert and oriented x 3 PSYCHIATRIC:  Normal affect   ASSESSMENT:    1. Typical atrial flutter ([name]    PLAN:    In order of problems listed above:  Typical atrial flutter - Noted on ECG on 11/24/2022 at 131 bpm with typical pattern, sawtooth.  Negative deflection in leads II, III, aVF with positive deflection in V1. - Cardizem CD 120 mg was added to her atenolol 100 mg daily. -She has been progressively short of breath.   Could be a component of diastolic heart failure secondary to atrial fibrillation or perhaps tachycardia induced cardiomyopathy.  TEE will be helpful. - We will go ahead and set her up for TEE cardioversion next week.  Risks and benefits have been explained.  Willing to proceed.  Husband present for discussion as well.  If we must, we will perform a chest wall echocardiogram here. Orders are in.   Chronic anticoagulation - Currently on warfarin 5 mg a day as opposed to Eliquis 5 mg twice a day.  Cost must have been an issue. - We will go ahead and stop her Coumadin and place her back on Eliquis and get her into patient assistance.  Her last INR was 1.2 after starting the Coumadin.  We will stop the Coumadin.  She does understand that she will need to meet a deductible first and then after she meets the deductible the Eliquis will be less expensive until donut hole.  Diabetes/hypertension/hyperlipidemia/morbid obesity - Per primary team.  Elevated triglycerides seen in the setting of hemoglobin A1c 7.2. -Continue to modify diet, weight loss.  Obesity is certainly a risk factor for atrial flutter.       Shared Decision Making/Informed Consent The risks [stroke, cardiac arrhythmias rarely resulting in the need for a temporary or permanent pacemaker, skin irritation or burns, esophageal damage, perforation (1:10,000 risk), bleeding, pharyngeal hematoma as well as other potential complications associated with conscious sedation including aspiration, arrhythmia, respiratory failure and death], benefits (treatment guidance, restoration of normal sinus rhythm, diagnostic support) and alternatives of a transesophageal echocardiogram guided cardioversion were discussed in detail with Ms. Rathod and she is willing to proceed.    Medication Adjustments/Labs and Tests Ordered: Current medicines are reviewed at length with the patient today.  Concerns regarding medicines are outlined above.  Orders Placed This  Encounter  Procedures  EKG 12-Lead   Meds ordered this encounter  Medications   apixaban (ELIQUIS) 5 MG TABS tablet    Sig: Take 1 tablet (5 mg total) by mouth 2 (two) times daily.    Dispense:  60 tablet    Refill:  6    Patient Instructions  Medication Instructions:  Please discontinue your Coumadin and start Eliquis 5 mg twice a day. Continue all other medications as listed.  *If you need a refill on your cardiac medications before your next appointment, please call your pharmacy*  Lab Work: None today. If you have labs (blood work) drawn today and your tests are completely normal, you will receive your results only by: [place] (if you have MyChart) OR A paper copy in the mail If you have any lab test that is abnormal or we need to change your treatment, we will call you to review the results.  Testing/Procedures: Your physician has requested that you have a TEE/Cardioversion. During a TEE, sound waves are used to create images of your heart. It provides your doctor with information about the size and shape of your heart and how well your heart's chambers and valves are working. In this test, a transducer is attached to the end of a flexible tube that is guided down you throat and into your esophagus (the tube leading from your mouth to your stomach) to get a more detailed image of your heart. Once the TEE has determined that a blood clot is not present, the cardioversion begins. Electrical Cardioversion uses a jolt of electricity to your heart either through paddles or wired patches attached to your chest. This is a controlled, usually prescheduled, procedure. This procedure is done at the hospital and you are not awake during the procedure. You usually go home the day of the procedure. Please see the instruction sheet given to you today for more information.    [place] A DEPT OF [place] A DEPT OF [facility]  [facility] [place], [place] [id number] [place] [address] Dept: [phone] Loc: [place]  12/01/2022  You are scheduled for a TEE/cardioversion on Thursday, January 18 with Dr.  [name] .  1. Please arrive at the [facility] (Main Entrance A) at [facility]: [address] [place], [place] [address] at 11:00 [place] parking service is available.   Special note: Every effort is made to have your procedure done on time. Please understand that emergencies sometimes delay scheduled procedures.  2. Diet: Do not eat or drink anything after midnight prior to your procedure except sips of water to take medications.  3. Labs: None needed.  4. Medication instructions in preparation for your procedure:  Do not take your Glipizide the morning of your procedure.  On the morning of your procedure, take your  Eliquis  and any other morning medications.  You may use sips of water.  6. Bring a current list of your medications and current insurance cards. 7. You MUST have a responsible person to drive you home. 8. Someone MUST be with you the first 24 hours after you arrive home or your discharge will be delayed. 9. Please wear clothes that are easy to get on and off and wear slip-on shoes.  Thank you for allowing [place] to care for you!   -- [facility]   Follow-Up: At [facility], you and  your health needs are our priority.  As part of our continuing mission to provide you with exceptional heart care, we have created designated Provider Care Teams.  These Care Teams include your primary Cardiologist (physician) and Advanced Practice Providers (APPs -  Physician Assistants and Nurse Practitioners) who all work together to provide you with the care you need, when you need it.  We recommend signing up for the patient portal called "MyChart".  Sign up information is provided on this After  Visit Summary.  MyChart is used to connect with patients for Virtual Visits (Telemedicine).  Patients are able to view lab/test results, encounter notes, upcoming appointments, etc.  Non-urgent messages can be sent to your provider as well.   To learn more about what you can do with MyChart, go to [email or web address].    Your next appointment:   4 week(s) (after procedure)  Provider:   Nicholes Rough, PA-C, Ambrose Pancoast, NP, Ermalinda Barrios, PA-C, Christen Bame, NP, or Richardson Dopp, PA-C            Signed, Candee Furbish, MD  12/01/2022 12:34 PM    [place]

## 2022-12-08 ENCOUNTER — Encounter (HOSPITAL_COMMUNITY)
Admission: RE | Disposition: A | Discharge status: Home / Self Care | Payer: Self-pay | Source: Home / Self Care | Attending: Internal Medicine

## 2022-12-08 ENCOUNTER — Ambulatory Visit (HOSPITAL_BASED_OUTPATIENT_CLINIC_OR_DEPARTMENT_OTHER): Payer: Medicare Other | Admitting: Certified Registered"

## 2022-12-08 ENCOUNTER — Ambulatory Visit (HOSPITAL_BASED_OUTPATIENT_CLINIC_OR_DEPARTMENT_OTHER)
Admission: RE | Admit: 2022-12-08 | Discharge: 2022-12-08 | Disposition: A | Discharge status: Home / Self Care | Payer: Medicare Other | Source: Ambulatory Visit | Attending: Cardiology | Admitting: Cardiology

## 2022-12-08 ENCOUNTER — Ambulatory Visit (HOSPITAL_COMMUNITY)
Admission: RE | Admit: 2022-12-08 | Discharge: 2022-12-08 | Disposition: A | Discharge status: Home / Self Care | Payer: Medicare Other | Attending: Internal Medicine | Admitting: Internal Medicine

## 2022-12-08 ENCOUNTER — Other Ambulatory Visit: Payer: Self-pay

## 2022-12-08 ENCOUNTER — Ambulatory Visit (HOSPITAL_COMMUNITY): Payer: Medicare Other | Admitting: Certified Registered"

## 2022-12-08 DIAGNOSIS — E1142 Type 2 diabetes mellitus with diabetic polyneuropathy: Secondary | ICD-10-CM | POA: Diagnosis not present

## 2022-12-08 DIAGNOSIS — Z7901 Long term (current) use of anticoagulants: Secondary | ICD-10-CM | POA: Diagnosis not present

## 2022-12-08 DIAGNOSIS — I4891 Unspecified atrial fibrillation: Secondary | ICD-10-CM

## 2022-12-08 DIAGNOSIS — K449 Diaphragmatic hernia without obstruction or gangrene: Secondary | ICD-10-CM | POA: Diagnosis not present

## 2022-12-08 DIAGNOSIS — E785 Hyperlipidemia, unspecified: Secondary | ICD-10-CM

## 2022-12-08 DIAGNOSIS — I4892 Unspecified atrial flutter: Secondary | ICD-10-CM | POA: Diagnosis not present

## 2022-12-08 DIAGNOSIS — I340 Nonrheumatic mitral (valve) insufficiency: Secondary | ICD-10-CM

## 2022-12-08 DIAGNOSIS — K219 Gastro-esophageal reflux disease without esophagitis: Secondary | ICD-10-CM | POA: Insufficient documentation

## 2022-12-08 DIAGNOSIS — I483 Typical atrial flutter: Secondary | ICD-10-CM | POA: Diagnosis not present

## 2022-12-08 DIAGNOSIS — I081 Rheumatic disorders of both mitral and tricuspid valves: Secondary | ICD-10-CM | POA: Insufficient documentation

## 2022-12-08 DIAGNOSIS — G4730 Sleep apnea, unspecified: Secondary | ICD-10-CM | POA: Diagnosis not present

## 2022-12-08 DIAGNOSIS — I10 Essential (primary) hypertension: Secondary | ICD-10-CM

## 2022-12-08 DIAGNOSIS — Z833 Family history of diabetes mellitus: Secondary | ICD-10-CM | POA: Diagnosis not present

## 2022-12-08 DIAGNOSIS — Z8719 Personal history of other diseases of the digestive system: Secondary | ICD-10-CM | POA: Diagnosis not present

## 2022-12-08 DIAGNOSIS — Z8249 Family history of ischemic heart disease and other diseases of the circulatory system: Secondary | ICD-10-CM | POA: Insufficient documentation

## 2022-12-08 DIAGNOSIS — Z7984 Long term (current) use of oral hypoglycemic drugs: Secondary | ICD-10-CM | POA: Diagnosis not present

## 2022-12-08 DIAGNOSIS — Z6841 Body Mass Index (BMI) 40.0 and over, adult: Secondary | ICD-10-CM | POA: Insufficient documentation

## 2022-12-08 DIAGNOSIS — Z79899 Other long term (current) drug therapy: Secondary | ICD-10-CM | POA: Diagnosis not present

## 2022-12-08 DIAGNOSIS — F419 Anxiety disorder, unspecified: Secondary | ICD-10-CM

## 2022-12-08 DIAGNOSIS — Z0000 Encounter for general adult medical examination without abnormal findings: Secondary | ICD-10-CM

## 2022-12-08 HISTORY — PX: TEE WITHOUT CARDIOVERSION: SHX5443

## 2022-12-08 HISTORY — PX: CARDIOVERSION: SHX1299

## 2022-12-08 LAB — POCT I-STAT, CHEM 8
BUN: 15 mg/dL (ref 8–23)
Calcium, Ion: 1.07 mmol/L — ABNORMAL LOW (ref 1.15–1.40)
Chloride: 103 mmol/L (ref 98–111)
Creatinine, Ser: 0.6 mg/dL (ref 0.44–1.00)
Glucose, Bld: 173 mg/dL — ABNORMAL HIGH (ref 70–99)
HCT: 47 % — ABNORMAL HIGH (ref 36.0–46.0)
Hemoglobin: 16 g/dL — ABNORMAL HIGH (ref 12.0–15.0)
Potassium: 3.9 mmol/L (ref 3.5–5.1)
Sodium: 141 mmol/L (ref 135–145)
TCO2: 24 mmol/L (ref 22–32)

## 2022-12-08 SURGERY — ECHOCARDIOGRAM, TRANSESOPHAGEAL
Anesthesia: General

## 2022-12-08 MED ORDER — PROPOFOL 10 MG/ML IV BOLUS
INTRAVENOUS | Status: DC | PRN
  Administered 2022-12-08: 20 mg via INTRAVENOUS

## 2022-12-08 MED ORDER — SODIUM CHLORIDE 0.9 % IV SOLN: INTRAVENOUS | Status: DC

## 2022-12-08 MED ORDER — MIDAZOLAM HCL 2 MG/2ML IJ SOLN
INTRAMUSCULAR | Status: AC
  Filled 2022-12-08: qty 2

## 2022-12-08 MED ORDER — MIDAZOLAM HCL 2 MG/2ML IJ SOLN
INTRAMUSCULAR | Status: DC | PRN
  Administered 2022-12-08: 1 mg via INTRAVENOUS

## 2022-12-08 MED ORDER — PHENYLEPHRINE 80 MCG/ML (10ML) SYRINGE FOR IV PUSH (FOR BLOOD PRESSURE SUPPORT)
PREFILLED_SYRINGE | INTRAVENOUS | Status: DC | PRN
  Administered 2022-12-08 (×2): 80 ug via INTRAVENOUS

## 2022-12-08 MED ORDER — PROPOFOL 500 MG/50ML IV EMUL
INTRAVENOUS | Status: DC | PRN
  Administered 2022-12-08: 150 ug/kg/min via INTRAVENOUS

## 2022-12-08 NOTE — CV Procedure (Signed)
TEE  Indication:  Atrial fibrillation/flutter  Patient sedated by anesthesia with propofol intravenously Bite guard placed in mouth TEE probe advanced through mouth into esophagus without difficulty   No masses in LA, RA, LAA.   Full report to follow in CV section of chart.  Procedure wihout complication  [name] MD

## 2022-12-08 NOTE — CV Procedure (Signed)
CARDIOVERSION  Indication:  Atrial fibrillation/flutter  Patient sedated by anesthesia with propofol intravenously   With pads in Apex/Base position patient cardioverted to SR with 200 J synchronized biphasic energy  PRocedure was without complication  12 lead EKG pending  Erin Carnes MD

## 2022-12-08 NOTE — Discharge Instructions (Signed)
Electrical Cardioversion °Electrical cardioversion is the delivery of a jolt of electricity to restore a normal rhythm to the heart. A rhythm that is too fast or is not regular keeps the heart from pumping well. In this procedure, sticky patches or metal paddles are placed on the chest to deliver electricity to the heart from a device. °This procedure may be done in an emergency if: °There is low or no blood pressure as a result of the heart rhythm. °Normal rhythm must be restored as fast as possible to protect the brain and heart from further damage. °It may save a life. °This may also be a scheduled procedure for irregular or fast heart rhythms that are not immediately life-threatening. ° °What can I expect after the procedure? °Your blood pressure, heart rate, breathing rate, and blood oxygen level will be monitored until you leave the hospital or clinic. °Your heart rhythm will be watched to make sure it does not change. °You may have some redness on the skin where the shocks were given. Over the counter cortizone cream may be helpful.  °Follow these instructions at home: °Do not drive for 24 hours if you were given a sedative during your procedure. °Take over-the-counter and prescription medicines only as told by your health care provider. °Ask your health care provider how to check your pulse. Check it often. °Rest for 48 hours after the procedure or as told by your health care provider. °Avoid or limit your caffeine use as told by your health care provider. °Keep all follow-up visits as told by your health care provider. This is important. °Contact a health care provider if: °You feel like your heart is beating too quickly or your pulse is not regular. °You have a serious muscle cramp that does not go away. °Get help right away if: °You have discomfort in your chest. °You are dizzy or you feel faint. °You have trouble breathing or you are short of breath. °Your speech is slurred. °You have trouble moving an  arm or leg on one side of your body. °Your fingers or toes turn cold or blue. °Summary °Electrical cardioversion is the delivery of a jolt of electricity to restore a normal rhythm to the heart. °This procedure may be done right away in an emergency or may be a scheduled procedure if the condition is not an emergency. °Generally, this is a safe procedure. °After the procedure, check your pulse often as told by your health care provider. °This information is not intended to replace advice given to you by your health care provider. Make sure you discuss any questions you have with your health care provider. °Document Revised: 06/10/2019 Document Reviewed: 06/10/2019 °Elsevier Patient Education © 2020 [employer] Inc.  °

## 2022-12-08 NOTE — Interval H&P Note (Signed)
History and Physical Interval Note:  12/08/2022 12:02 PM  [name]  has presented today for surgery, with the diagnosis of AFLUTTER.  The various methods of treatment have been discussed with the patient and family. After consideration of risks, benefits and other options for treatment, the patient has consented to  Procedure(s): TRANSESOPHAGEAL ECHOCARDIOGRAM (TEE) (N/A) CARDIOVERSION (N/A) as a surgical intervention.  The patient's history has been reviewed, patient examined, no change in status, stable for surgery.  I have reviewed the patient's chart and labs.  Questions were answered to the patient's satisfaction.     [name]

## 2022-12-08 NOTE — Anesthesia Preprocedure Evaluation (Signed)
Anesthesia Evaluation  Patient identified by MRN, date of birth, ID band Patient awake    Reviewed: Allergy & Precautions, NPO status , Patient's Chart, lab work & pertinent test results, reviewed documented beta blocker date and time   History of Anesthesia Complications (+) AWARENESS UNDER ANESTHESIA, Family history of anesthesia reaction and history of anesthetic complications  Airway Mallampati: II  TM Distance: >3 FB Neck ROM: Full    Dental no notable dental hx. (+) Teeth Intact, Caps, Dental Advisory Given   Pulmonary sleep apnea and Continuous Positive Airway Pressure Ventilation    breath sounds clear to auscultation + decreased breath sounds      Cardiovascular hypertension, Pt. on medications and Pt. on home beta blockers  Rhythm:Irregular Rate:Tachycardia     Neuro/Psych   Anxiety     Diabetic peripheral neuropathy  Neuromuscular disease    GI/Hepatic Neg liver ROS, hiatal hernia,GERD  Medicated and Controlled,,  Endo/Other  diabetes, Well Controlled, Type 2, Oral Hypoglycemic Agents  Morbid obesityHyperlipidemia  Renal/GU negative Renal ROS  negative genitourinary   Musculoskeletal  (+) Arthritis , Osteoarthritis,    Abdominal  (+) + obese  Peds  Hematology Eliquis therapy- last dose this am   Anesthesia Other Findings   Reproductive/Obstetrics                             Anesthesia Physical Anesthesia Plan  ASA: 3  Anesthesia Plan: General   Post-op Pain Management: Minimal or no pain anticipated   Induction: Intravenous  PONV Risk Score and Plan: 3 and Treatment may vary due to age or medical condition and Propofol infusion  Airway Management Planned: Natural Airway and Mask  Additional Equipment: None  Intra-op Plan:   Post-operative Plan:   Informed Consent: I have reviewed the patients History and Physical, chart, labs and discussed the procedure including the  risks, benefits and alternatives for the proposed anesthesia with the patient or authorized representative who has indicated his/her understanding and acceptance.     Dental advisory given  Plan Discussed with: CRNA and Anesthesiologist  Anesthesia Plan Comments:        Anesthesia Quick Evaluation

## 2022-12-08 NOTE — Anesthesia Postprocedure Evaluation (Signed)
Anesthesia Post Note  Patient: [name]  Procedure(s) Performed: TRANSESOPHAGEAL ECHOCARDIOGRAM (TEE) CARDIOVERSION     Patient location during evaluation: PACU Anesthesia Type: General Level of consciousness: awake and alert and oriented Pain management: pain level controlled Vital Signs Assessment: post-procedure vital signs reviewed and stable Respiratory status: spontaneous breathing, nonlabored ventilation and respiratory function stable Cardiovascular status: blood pressure returned to baseline and stable Postop Assessment: no apparent nausea or vomiting Anesthetic complications: no   No notable events documented.  Last Vitals:  Vitals:   12/08/22 1300 12/08/22 1308  BP: 105/65 105/65  Pulse: 70 61  Resp: 19 15  Temp:    SpO2: 94% 97%    Last Pain:  Vitals:   12/08/22 1300  TempSrc:   PainSc: 0-No pain                 Brysyn Brandenberger A.

## 2022-12-08 NOTE — Progress Notes (Signed)
Echocardiogram Echocardiogram Transesophageal has been performed.  Fidel Levy 12/08/2022, 1:40 PM

## 2022-12-08 NOTE — Transfer of Care (Signed)
Immediate Anesthesia Transfer of Care Note  Patient: SHAWNTA SCHLEGEL  Procedure(s) Performed: TRANSESOPHAGEAL ECHOCARDIOGRAM (TEE) CARDIOVERSION  Patient Location: Endoscopy Unit  Anesthesia Type:MAC  Level of Consciousness: drowsy and patient cooperative  Airway & Oxygen Therapy: Patient Spontanous Breathing and Patient connected to nasal cannula oxygen  Post-op Assessment: Report given to RN, Post -op Vital signs reviewed and stable, and Patient moving all extremities X 4  Post vital signs: Reviewed and stable  Last Vitals:  Vitals Value Taken Time  BP    Temp    Pulse 67 12/08/22 1246  Resp 16 12/08/22 1246  SpO2 93 % 12/08/22 1246  Vitals shown include unvalidated device data.  Last Pain:  Vitals:   12/08/22 1107  TempSrc: Temporal  PainSc: 0-No pain         Complications: No notable events documented.

## 2022-12-11 ENCOUNTER — Encounter (HOSPITAL_COMMUNITY): Payer: Self-pay | Admitting: Internal Medicine

## 2022-12-12 ENCOUNTER — Encounter: Payer: Self-pay | Admitting: Physician Assistant

## 2023-01-04 NOTE — Progress Notes (Unsigned)
Cardiology Office Note:    Date:  01/05/2023   ID:  [name], DOB 1956-11-21, MRN [id number]  PCP:  Dion Body, MD   [facility] Providers Cardiologist:  Candee Furbish, MD     Referring MD: Dion Body, MD   Chief Complaint: Atrial flutter  History of Present Illness:    [name] is a very pleasant 67 y.o. female with a hx of atrial flutter, HTN, HLD, and obesity.  She was referred to our office for evaluation of atrial flutter and seen by Dr. Marlou Porch on 12/01/2022.  No prior stroke or vascular history.  No prior cardiac issues.  Her symptoms of atrial flutter are new with intermittent symptoms.  Just before bed on 11/14/2022 she felt something in her chest and that shortness of breath was increasing.  Pulse ox at home did not detect.  No chest pain or dizziness. Had EKG 11/24/22 with a flutter at 131 bpm, typical sawtooth pattern, negative deflection in leads II, III, aVF with positive deflection in V1.  She was already on atenolol 100 mg daily.  Cardizem CD 120 mg was added.  She has been progressively short of breath. Initially started on Eliquis but cost was an issue, now on warfarin.  Dr. Marlou Porch advised her to stop Coumadin and we will work with her to get patient assistance for Eliquis. Scheduled for TEE cardioversion. Successful DCCV on 12/08/22.  TEE with difficult images due to ankles, elevated HR. Overall LVEF appears moderately depressed, consider TTE when HR improved.  Mildly reduced RV, no left atrial/left atrial appendage thrombus detected. Mild MR, mild (Grade II) aortic valve plaque.  Today, she is here for follow-up and is accompanied by her husband. Reports she is feeling well. Feels an occasional flutter, most often when she is lying down. Has had shortness of breath for some time but it was worse while in atrial flutter. She denies chest pain, palpitations, presyncope, syncope. Has bilateral lower extremity that has been stable.  Admits that she needs  to start to exercise.  Her husband has had atrial fibrillation for a long time and has been through multiple treatments. No specific cardiac concerns. No problems with medications. Wants to know if she will be on Eliquis long-term.  Past Medical History:  Diagnosis Date   Actinic keratosis    Allergy    Anxiety    Arthritis    Back pain    Displaced fracture of olecranon process without intraarticular extension of left ulna, initial encounter for closed fracture 12/13/2016   Edema of both lower extremities    Family history of anesthesia complication    PARENTS HAD NAUSEA   Fracture of radial head, left, closed 12/13/2016   GERD (gastroesophageal reflux disease)    prilosec once/week   H/O hiatal hernia    Hypertension    Joint pain    Obesity    Osteoarthritis of right knee 08/05/2014   Palpitations    Sleep apnea    cpap   SOB (shortness of breath)     Past Surgical History:  Procedure Laterality Date   ABDOMINAL HYSTERECTOMY     BREAST BIOPSY Right    neg/stereo   BREAST EXCISIONAL BIOPSY Right 2003   neg   BREAST SURGERY     breast biopsy - benign   CARDIOVERSION N/A 12/08/2022   Procedure: CARDIOVERSION;  Surgeon: Fay Records, MD;  Location: [place];  Service: Cardiovascular;  Laterality: N/A;   CHOLECYSTECTOMY  COLONOSCOPY  multiple   ORIF ELBOW FRACTURE Left 12/13/2016   Procedure: OPEN REDUCTION INTERNAL FIXATION (ORIF) OLECRANON AND PROXIMAL RADIUS;  Surgeon: Marchia Bond, MD;  Location: [place];  Service: Orthopedics;  Laterality: Left;   PARTIAL KNEE ARTHROPLASTY Right 08/05/2014   DR Mardelle Matte   PARTIAL KNEE ARTHROPLASTY Right 08/05/2014   Procedure: RIGHT UNICOMPARTMENTAL KNEE;  Surgeon: Johnny Bridge, MD;  Location: [place];  Service: Orthopedics;  Laterality: Right;   TEE WITHOUT CARDIOVERSION N/A 12/08/2022   Procedure: TRANSESOPHAGEAL ECHOCARDIOGRAM (TEE);  Surgeon: Fay Records, MD;  Location: [place];  Service:  Cardiovascular;  Laterality: N/A;   TUBAL LIGATION     UPPER GASTROINTESTINAL ENDOSCOPY  10/2005   hiatal hernia, reflux   WRIST FRACTURE SURGERY  04/14/11   left Dr. Fredna Dow    Current Medications: Current Meds  Medication Sig   apixaban (ELIQUIS) 5 MG TABS tablet Take 1 tablet (5 mg total) by mouth 2 (two) times daily.   atenolol (TENORMIN) 100 MG tablet Take 100 mg by mouth in the morning.   b complex vitamins capsule Take 1 capsule by mouth every evening.   Cholecalciferol (VITAMIN D3) 5000 UNITS CAPS Take 5,000 Units by mouth every evening.   citalopram (CELEXA) 20 MG tablet Take 30 mg by mouth every evening.   diltiazem (CARDIZEM CD) 120 MG 24 hr capsule Take 120 mg by mouth in the morning.   fluticasone (FLONASE) 50 MCG/ACT nasal spray Place 1 spray into both nostrils daily as needed for allergies or rhinitis.   gabapentin (NEURONTIN) 300 MG capsule Take 600 mg by mouth at bedtime.   glipiZIDE (GLUCOTROL) 10 MG tablet Take 10 mg by mouth in the morning and at bedtime.   hydrALAZINE (APRESOLINE) 25 MG tablet Take 25 mg by mouth at bedtime.   lisinopril-hydrochlorothiazide (PRINZIDE,ZESTORETIC) 20-12.5 MG per tablet Take 2 tablets by mouth in the morning.   magnesium oxide (MAG-OX) 400 MG tablet Take 400 mg by mouth in the morning.   pravastatin (PRAVACHOL) 20 MG tablet Take 20 tablets by mouth at bedtime.   VITAMIN A PO Take 1 capsule by mouth in the morning.   vitamin C (ASCORBIC ACID) 500 MG tablet Take 500 mg by mouth every evening.     Allergies:   Wheat, Codeine, and Metformin   Social History   Socioeconomic History   Marital status: Married    Spouse name: Gle   Number of children: 2   Years of education: Not on file   Highest education level: Not on file  Occupational History   Occupation: CMA, Catering manager, [place], Retired    Fish farm manager: Therapist, music  Tobacco Use   Smoking status: Never   Smokeless tobacco: Never  Vaping Use   Vaping Use: Never used   Substance and Sexual Activity   Alcohol use: No   Drug use: No   Sexual activity: Not on file  Other Topics Concern   Not on file  Social History Narrative   Regular Exercise:  Yes, elliptical   Diet:  Mostly fruits and veggies, some meat.   Enjoys:  Firefighter, crocheting, sewing, playing with grandchildren.   Social Determinants of Health   Financial Resource Strain: Not on file  Food Insecurity: Not on file  Transportation Needs: Not on file  Physical Activity: Not on file  Stress: Not on file  Social Connections: Not on file     Family History: The patient's family history includes Arthritis in her father; Asthma in  her daughter; Cancer in her paternal aunt and paternal uncle; Diabetes in her mother; Heart disease in her father and mother; Hyperlipidemia in her father and mother; Hypertension in her father and mother; Rectal cancer in her paternal aunt; Stroke in her mother. There is no history of Breast cancer.  ROS:   Please see the history of present illness.    + 1+ pitting edema bilateral LE All other systems reviewed and are negative.  Labs/Other Studies Reviewed:    The following studies were reviewed today:  TEE 12/08/22 1. Difficult to accurately assess LVEF give angles, elevated HR . Overall  LVEF appears moderately depressed Consider TTE when HR / rhythm improved.   2. Right ventricular systolic function is mildly reduced. The right  ventricular size is normal.   3. No left atrial/left atrial appendage thrombus was detected.   4. The mitral valve is normal in structure. Mild mitral valve  regurgitation.   5. The aortic valve is tricuspid. Aortic valve regurgitation is trivial.  Aortic valve sclerosis is present, with no evidence of aortic valve  stenosis.   6. There is mild (Grade II) plaque.  Recent Labs: 12/08/2022: BUN 15; Creatinine, Ser 0.60; Hemoglobin 16.0; Potassium 3.9; Sodium 141  Recent Lipid Panel    Component Value Date/Time   CHOL 142  06/22/2021 0913   TRIG 217 (H) 06/22/2021 0913   HDL 43 06/22/2021 0913   LDLCALC 64 06/22/2021 0913     Risk Assessment/Calculations:    CHA2DS2-VASc Score = 4  { This indicates a 4.8% annual risk of stroke. The patient's score is based upon: CHF History: 0 HTN History: 1 Diabetes History: 1 Stroke History: 0 Vascular Disease History: 0 Age Score: 1 Gender Score: 1    Physical Exam:    VS:  BP 128/78   Pulse 65   Ht 5' 4"$  (1.626 m)   Wt (!) 307 lb 3.2 oz (139.3 kg)   SpO2 95%   BMI 52.73 kg/m     Wt Readings from Last 3 Encounters:  01/05/23 (!) 307 lb 3.2 oz (139.3 kg)  12/08/22 (!) 309 lb (140.2 kg)  12/01/22 (!) 314 lb 3.2 oz (142.5 kg)     GEN:  Well nourished, well developed in no acute distress HEENT: Normal NECK: No JVD; No carotid bruits CARDIAC: RRR, no murmurs, rubs, gallops RESPIRATORY:  Clear to auscultation without rales, wheezing or rhonchi  ABDOMEN: Soft, non-tender, non-distended MUSCULOSKELETAL:  Bilateral LE edema; No deformity. 2+ pedal pulses, equal bilaterally SKIN: Warm and dry NEUROLOGIC:  Alert and oriented x 3 PSYCHIATRIC:  Normal affect   EKG:  EKG is ordered today.  The ekg ordered today demonstrates normal sinus rhythm at 65 bpm, no ST abnormality     Diagnoses:    1. Typical atrial flutter ([place])   2. Chronic anticoagulation   3. Benign essential HTN   4. Class 3 severe obesity due to excess calories with serious comorbidity and body mass index (BMI) of 50.0 to 59.9 in adult ([place])   5. Mixed hyperlipidemia   6. Hypertriglyceridemia    Assessment and Plan:     Atrial flutter on chronic anticoagulation: Successful DCCV 12/08/22. Is maintaining sinus rhythm today at 65 bpm. Feeling well with improvement in shortness of breath. We will get transthoracic echo in the setting of not getting good images on TEE 2/2 tachycardia. Brief discussion about treatment options if atrial flutter returns.  Her husband is a patient of Dr. Curt Bears  and has undergone  multiple cardioversion and A-fib ablation x 2.  Encouraged her to notify us if symptoms or presence of atrial flutter returns. Continue rate control with Cardizem, atenolol. Continue Eliquis 5 mg twice daily for stroke prevention. We have completed patient assistance paperwork for Eliquis.  Hypertension: BP is well controlled. Continue Zestoretic, hydralazine, Cardizem, atenolol.   Hyperlipidemia/hypertriglyceridemia: LDL 44, triglycerides 264 on 11/17/2022. Encouraged 150 minutes of moderate intensity exercise each week as well as heart healthy diet.  Continue Pravachol.  Obesity: Admits she does not exercise on a regular basis. Encouraged 150 minutes of moderate intensity exercise each week along with heart healthy mostly plant based diet to reduce CV risk.    Disposition: 3-4 months with Dr. Marlou Porch or me  Medication Adjustments/Labs and Tests Ordered: Current medicines are reviewed at length with the patient today.  Concerns regarding medicines are outlined above.  Orders Placed This Encounter  Procedures   EKG 12-Lead   ECHOCARDIOGRAM COMPLETE   No orders of the defined types were placed in this encounter.   Patient Instructions  Medication Instructions:   Your physician recommends that you continue on your current medications as directed. Please refer to the Current Medication list given to you today.   *If you need a refill on your cardiac medications before your next appointment, please call your pharmacy*   Lab Work:  None ordered.  If you have labs (blood work) drawn today and your tests are completely normal, you will receive your results only by: [place] (if you have MyChart) OR A paper copy in the mail If you have any lab test that is abnormal or we need to change your treatment, we will call you to review the results.   Testing/Procedures:  Your physician has requested that you have an echocardiogram. Echocardiography is a painless test  that uses sound waves to create images of your heart. It provides your doctor with information about the size and shape of your heart and how well your heart's chambers and valves are working. This procedure takes approximately one hour. There are no restrictions for this procedure. Please do NOT wear perfume or lotions (deodorant is allowed). Please arrive 15 minutes prior to your appointment time.'    Follow-Up: At [facility], you and your health needs are our priority.  As part of our continuing mission to provide you with exceptional heart care, we have created designated Provider Care Teams.  These Care Teams include your primary Cardiologist (physician) and Advanced Practice Providers (APPs -  Physician Assistants and Nurse Practitioners) who all work together to provide you with the care you need, when you need it.  We recommend signing up for the patient portal called "MyChart".  Sign up information is provided on this After Visit Summary.  MyChart is used to connect with patients for Virtual Visits (Telemedicine).  Patients are able to view lab/test results, encounter notes, upcoming appointments, etc.  Non-urgent messages can be sent to your provider as well.   To learn more about what you can do with MyChart, go to [email or web address].    Your next appointment:   3 month(s)  Provider:   Wallene Huh       Signed, Zackarey Holleman, Lanice Schwab, NP  01/05/2023 4:57 PM    [place]

## 2023-01-05 ENCOUNTER — Ambulatory Visit: Payer: Medicare Other | Attending: Nurse Practitioner | Admitting: Nurse Practitioner

## 2023-01-05 ENCOUNTER — Telehealth: Payer: Self-pay

## 2023-01-05 ENCOUNTER — Encounter: Payer: Self-pay | Admitting: Nurse Practitioner

## 2023-01-05 VITALS — BP 128/78 | HR 65 | Ht 64.0 in | Wt 307.2 lb

## 2023-01-05 DIAGNOSIS — Z6841 Body Mass Index (BMI) 40.0 and over, adult: Secondary | ICD-10-CM

## 2023-01-05 DIAGNOSIS — E781 Pure hyperglyceridemia: Secondary | ICD-10-CM | POA: Diagnosis present

## 2023-01-05 DIAGNOSIS — I483 Typical atrial flutter: Secondary | ICD-10-CM

## 2023-01-05 DIAGNOSIS — E782 Mixed hyperlipidemia: Secondary | ICD-10-CM | POA: Insufficient documentation

## 2023-01-05 DIAGNOSIS — Z7901 Long term (current) use of anticoagulants: Secondary | ICD-10-CM | POA: Diagnosis present

## 2023-01-05 DIAGNOSIS — I10 Essential (primary) hypertension: Secondary | ICD-10-CM | POA: Diagnosis present

## 2023-01-05 NOTE — Patient Instructions (Signed)
Medication Instructions:   Your physician recommends that you continue on your current medications as directed. Please refer to the Current Medication list given to you today.   *If you need a refill on your cardiac medications before your next appointment, please call your pharmacy*   Lab Work:  None ordered.  If you have labs (blood work) drawn today and your tests are completely normal, you will receive your results only by: [place] (if you have MyChart) OR A paper copy in the mail If you have any lab test that is abnormal or we need to change your treatment, we will call you to review the results.   Testing/Procedures:  Your physician has requested that you have an echocardiogram. Echocardiography is a painless test that uses sound waves to create images of your heart. It provides your doctor with information about the size and shape of your heart and how well your heart's chambers and valves are working. This procedure takes approximately one hour. There are no restrictions for this procedure. Please do NOT wear perfume or lotions (deodorant is allowed). Please arrive 15 minutes prior to your appointment time.'    Follow-Up: At [facility], you and your health needs are our priority.  As part of our continuing mission to provide you with exceptional heart care, we have created designated Provider Care Teams.  These Care Teams include your primary Cardiologist (physician) and Advanced Practice Providers (APPs -  Physician Assistants and Nurse Practitioners) who all work together to provide you with the care you need, when you need it.  We recommend signing up for the patient portal called "MyChart".  Sign up information is provided on this After Visit Summary.  MyChart is used to connect with patients for Virtual Visits (Telemedicine).  Patients are able to view lab/test results, encounter notes, upcoming appointments, etc.  Non-urgent messages can be sent to your  provider as well.   To learn more about what you can do with MyChart, go to [email or web address].    Your next appointment:   3 month(s)  Provider:   Wallene Huh

## 2023-01-05 NOTE — Telephone Encounter (Addendum)
**Note De-Identified [name] Obfuscation** A completed and signed by Christen Bame, NP [employer] application for Eliquis assistance.was e-mailed to me with the pts financial documents. I have faxed all to [employer] at [phone] [name] Onbase.  Fax: Tx 'ok' Report CONE_EMAIL-to-Fax [name], [name] This message was sent [name] [facility], a product from [employer] [email or web address]                    -------Fax Transmission Report-------  To:               Recipient at [phone] Subject:          Fw: Eliquis Assistance Result:           The transmission was successful. Explanation:      All Pages Ok Pages Sent:       7 Connect Time:     10 minutes, 49 seconds Transmit Time:    01/05/2023 14:17 Transfer Rate:    14400 Status Code:      0000 Retry Count:      0 Job Id:           [id number] Unique [id number] Fax Line:         19 Fax Server:       [id number]

## 2023-01-10 NOTE — Telephone Encounter (Signed)
**Note De-Identified Sosha Shepherd Obfuscation** Letter received from [facility] stating that they have denied the pt Eliquis assistance. Documentation of 3% out of pocket RX expenses, based on the household adjusted gross income, not met. [id number]  The letter states that they have notified the pt of this denial as well.

## 2023-02-01 ENCOUNTER — Ambulatory Visit (HOSPITAL_COMMUNITY): Payer: Medicare Other | Attending: Cardiovascular Disease

## 2023-02-01 DIAGNOSIS — I483 Typical atrial flutter: Secondary | ICD-10-CM | POA: Insufficient documentation

## 2023-02-01 LAB — ECHOCARDIOGRAM COMPLETE
Area-P 1/2: 4.39 cm2
S' Lateral: 3.5 cm

## 2023-02-03 ENCOUNTER — Other Ambulatory Visit: Payer: Self-pay | Admitting: *Deleted

## 2023-02-03 DIAGNOSIS — I77810 Thoracic aortic ectasia: Secondary | ICD-10-CM

## 2023-03-28 ENCOUNTER — Other Ambulatory Visit: Payer: Self-pay | Admitting: Family Medicine

## 2023-03-28 DIAGNOSIS — Z1231 Encounter for screening mammogram for malignant neoplasm of breast: Secondary | ICD-10-CM

## 2023-04-21 ENCOUNTER — Encounter: Payer: Self-pay | Admitting: Cardiology

## 2023-04-21 ENCOUNTER — Ambulatory Visit: Payer: Medicare Other | Attending: Cardiology | Admitting: Cardiology

## 2023-04-21 VITALS — BP 122/72 | HR 73 | Ht 64.0 in | Wt 309.8 lb

## 2023-04-21 DIAGNOSIS — I77810 Thoracic aortic ectasia: Secondary | ICD-10-CM | POA: Diagnosis present

## 2023-04-21 DIAGNOSIS — Z7901 Long term (current) use of anticoagulants: Secondary | ICD-10-CM | POA: Diagnosis present

## 2023-04-21 DIAGNOSIS — I483 Typical atrial flutter: Secondary | ICD-10-CM | POA: Diagnosis present

## 2023-04-21 NOTE — Progress Notes (Signed)
Cardiology Office Note:    Date:  04/21/2023   ID:  AUBRIONNA HOLEMAN, DOB 1956/04/26, MRN [id number]  PCP:  Marisue Ivan, MD   [facility] Providers Cardiologist:  Donato Schultz, MD     Referring MD: Marisue Ivan, MD    History of Present Illness:    ESTALENE KALU is a 67 y.o. female here for the follow-up of atrial flutter.  No prior stroke.  Intermittent symptoms.  Back in January 2024 had atrial flutter 131 bpm.  Had a typical pattern.  Had been on atenolol 100 mg and diltiazem 120 was added.  On warfarin because Eliquis too expensive but she on.  Cardioversion TEE.  EF moderately depressed.  Can feel a flutter when laying down.  She has felt this off and on since high school.  Currently working on weight loss.  Overall doing well.  Has some shortness of breath with activity.  Repeat echocardiogram showed normal ejection fraction in March 2024.  Her aorta was 41 mm.  Past Medical History:  Diagnosis Date   Actinic keratosis    Allergy    Anxiety    Arthritis    Back pain    Displaced fracture of olecranon process without intraarticular extension of left ulna, initial encounter for closed fracture 12/13/2016   Edema of both lower extremities    Family history of anesthesia complication    PARENTS HAD NAUSEA   Fracture of radial head, left, closed 12/13/2016   GERD (gastroesophageal reflux disease)    prilosec once/week   H/O hiatal hernia    Hypertension    Joint pain    Obesity    Osteoarthritis of right knee 08/05/2014   Palpitations    Sleep apnea    cpap   SOB (shortness of breath)     Past Surgical History:  Procedure Laterality Date   ABDOMINAL HYSTERECTOMY     BREAST BIOPSY Right    neg/stereo   BREAST EXCISIONAL BIOPSY Right 2003   neg   BREAST SURGERY     breast biopsy - benign   CARDIOVERSION N/A 12/08/2022   Procedure: CARDIOVERSION;  Surgeon: Pricilla Riffle, MD;  Location: [facility] ENDOSCOPY;  Service: Cardiovascular;  Laterality: N/A;    CHOLECYSTECTOMY     COLONOSCOPY  multiple   ORIF ELBOW FRACTURE Left 12/13/2016   Procedure: OPEN REDUCTION INTERNAL FIXATION (ORIF) OLECRANON AND PROXIMAL RADIUS;  Surgeon: Teryl Lucy, MD;  Location: [facility];  Service: Orthopedics;  Laterality: Left;   PARTIAL KNEE ARTHROPLASTY Right 08/05/2014   DR Dion Saucier   PARTIAL KNEE ARTHROPLASTY Right 08/05/2014   Procedure: RIGHT UNICOMPARTMENTAL KNEE;  Surgeon: Eulas Post, MD;  Location: [facility] OR;  Service: Orthopedics;  Laterality: Right;   TEE WITHOUT CARDIOVERSION N/A 12/08/2022   Procedure: TRANSESOPHAGEAL ECHOCARDIOGRAM (TEE);  Surgeon: Pricilla Riffle, MD;  Location: [facility] ENDOSCOPY;  Service: Cardiovascular;  Laterality: N/A;   TUBAL LIGATION     UPPER GASTROINTESTINAL ENDOSCOPY  10/2005   hiatal hernia, reflux   WRIST FRACTURE SURGERY  04/14/11   left Dr. Merlyn Lot    Current Medications: Current Meds  Medication Sig   apixaban (ELIQUIS) 5 MG TABS tablet Take 1 tablet (5 mg total) by mouth 2 (two) times daily.   atenolol (TENORMIN) 100 MG tablet Take 100 mg by mouth in the morning.   b complex vitamins capsule Take 1 capsule by mouth every evening.   cetirizine (ZYRTEC) 10 MG tablet Take 10 mg by mouth daily.  Cholecalciferol (VITAMIN D3) 5000 UNITS CAPS Take 5,000 Units by mouth every evening.   citalopram (CELEXA) 20 MG tablet Take 30 mg by mouth every evening.   diltiazem (CARDIZEM CD) 120 MG 24 hr capsule Take 120 mg by mouth in the morning.   fluticasone (FLONASE) 50 MCG/ACT nasal spray Place 1 spray into both nostrils daily as needed for allergies or rhinitis.   furosemide (LASIX) 20 MG tablet Take 20 mg by mouth daily.   gabapentin (NEURONTIN) 300 MG capsule Take 600 mg by mouth at bedtime.   glipiZIDE (GLUCOTROL) 10 MG tablet Take 10 mg by mouth in the morning and at bedtime.   hydrALAZINE (APRESOLINE) 25 MG tablet Take 25 mg by mouth at bedtime.   lisinopril-hydrochlorothiazide (PRINZIDE,ZESTORETIC) 20-12.5 MG  per tablet Take 2 tablets by mouth in the morning.   magnesium oxide (MAG-OX) 400 MG tablet Take 400 mg by mouth in the morning.   pravastatin (PRAVACHOL) 20 MG tablet Take 20 tablets by mouth at bedtime.   VITAMIN A PO Take 1 capsule by mouth in the morning.   vitamin C (ASCORBIC ACID) 500 MG tablet Take 500 mg by mouth every evening.     Allergies:   Wheat, Codeine, and Metformin   Social History   Socioeconomic History   Marital status: Married    Spouse name: Gle   Number of children: 2   Years of education: Not on file   Highest education level: Not on file  Occupational History   Occupation: CMA, Geneticist, molecular, [employer], Retired    Associate Professor: Nature conservation officer  Tobacco Use   Smoking status: Never   Smokeless tobacco: Never  Vaping Use   Vaping Use: Never used  Substance and Sexual Activity   Alcohol use: No   Drug use: No   Sexual activity: Not on file  Other Topics Concern   Not on file  Social History Narrative   Regular Exercise:  Yes, elliptical   Diet:  Mostly fruits and veggies, some meat.   Enjoys:  Archivist, crocheting, sewing, playing with grandchildren.   Social Determinants of Health   Financial Resource Strain: Not on file  Food Insecurity: Not on file  Transportation Needs: Not on file  Physical Activity: Not on file  Stress: Not on file  Social Connections: Not on file     Family History: The patient's family history includes Arthritis in her father; Asthma in her daughter; Cancer in her paternal aunt and paternal uncle; Diabetes in her mother; Heart disease in her father and mother; Hyperlipidemia in her father and mother; Hypertension in her father and mother; Rectal cancer in her paternal aunt; Stroke in her mother. There is no history of Breast cancer.  ROS:   Please see the history of present illness.     All other systems reviewed and are negative.  EKGs/Labs/Other Studies Reviewed:    The following studies were reviewed  today: Cardiac Studies & Procedures       ECHOCARDIOGRAM  ECHOCARDIOGRAM COMPLETE 02/01/2023  Narrative ECHOCARDIOGRAM REPORT    Patient Name:   SAMARIYAH MARTINS Date of Exam: 02/01/2023 Medical Rec #:  [id number]       Height:       64.0 in Accession #:    [id number]      Weight:       307.2 lb Date of Birth:  Jan 23, 1956        BSA:          2.348 m Patient Age:  66 years        BP:           128/78 mmHg Patient Gender: F               HR:           77 bpm. Exam Location:  [address]  Procedure: 2D Echo, Cardiac Doppler, Color Doppler and 3D Echo  Indications:    Atrial fibrillation I48.91  History:        Patient has no prior history of Echocardiogram examinations. Risk Factors:Hypertension.  Sonographer:    Thurman Coyer RDCS Referring Phys: [id number] Zachary George SWINYER  IMPRESSIONS   1. Left ventricular ejection fraction, by estimation, is 55 to 60%. Left ventricular ejection fraction by PLAX is 57 %. The left ventricle has normal function. The left ventricle has no regional wall motion abnormalities. Left ventricular diastolic parameters were normal. 2. Right ventricular systolic function is normal. The right ventricular size is normal. There is normal pulmonary artery systolic pressure. 3. The mitral valve is normal in structure. Trivial mitral valve regurgitation. No evidence of mitral stenosis. 4. The aortic valve is normal in structure. Aortic valve regurgitation is not visualized. No aortic stenosis is present. 5. Aortic dilatation noted. There is mild dilatation of the ascending aorta, measuring 41 mm. 6. The inferior vena cava is normal in size with greater than 50% respiratory variability, suggesting right atrial pressure of 3 mmHg.  FINDINGS Left Ventricle: Left ventricular ejection fraction, by estimation, is 55 to 60%. Left ventricular ejection fraction by PLAX is 57 %. The left ventricle has normal function. The left ventricle has no regional wall motion  abnormalities. The left ventricular internal cavity size was normal in size. There is no left ventricular hypertrophy. Left ventricular diastolic parameters were normal. Indeterminate filling pressures.  Right Ventricle: The right ventricular size is normal. No increase in right ventricular wall thickness. Right ventricular systolic function is normal. There is normal pulmonary artery systolic pressure. The tricuspid regurgitant velocity is 1.46 m/s, and with an assumed right atrial pressure of 3 mmHg, the estimated right ventricular systolic pressure is 11.5 mmHg.  Left Atrium: Left atrial size was normal in size.  Right Atrium: Right atrial size was normal in size.  Pericardium: There is no evidence of pericardial effusion.  Mitral Valve: The mitral valve is normal in structure. Trivial mitral valve regurgitation. No evidence of mitral valve stenosis.  Tricuspid Valve: The tricuspid valve is normal in structure. Tricuspid valve regurgitation is trivial. No evidence of tricuspid stenosis.  Aortic Valve: The aortic valve is normal in structure. Aortic valve regurgitation is not visualized. No aortic stenosis is present.  Pulmonic Valve: The pulmonic valve was normal in structure. Pulmonic valve regurgitation is not visualized. No evidence of pulmonic stenosis.  Aorta: Aortic dilatation noted. There is mild dilatation of the ascending aorta, measuring 41 mm.  Venous: The inferior vena cava is normal in size with greater than 50% respiratory variability, suggesting right atrial pressure of 3 mmHg.  IAS/Shunts: No atrial level shunt detected by color flow Doppler.   LEFT VENTRICLE PLAX 2D LV EF:         Left            Diastology ventricular     LV e' medial:    9.03 cm/s ejection        LV E/e' medial:  10.2 fraction by     LV e' lateral:   12.20 cm/s PLAX is 57  LV E/e' lateral: 7.5 %. LVIDd:         5.00 cm LVIDs:         3.50 cm LV PW:         1.10 cm LV IVS:        0.90 cm          3D Volume EF: LVOT diam:     2.30 cm         3D EF:        55 % LV SV:         105             LV EDV:       161 ml LV SV Index:   45              LV ESV:       73 ml LVOT Area:     4.15 cm        LV SV:        88 ml   RIGHT VENTRICLE RV Basal diam:  4.40 cm RV Mid diam:    3.30 cm RV S prime:     12.70 cm/s TAPSE (M-mode): 2.8 cm  LEFT ATRIUM             Index        RIGHT ATRIUM           Index LA diam:        4.50 cm 1.92 cm/m   RA Area:     20.50 cm LA Vol (A2C):   65.5 ml 27.90 ml/m  RA Volume:   60.40 ml  25.73 ml/m LA Vol (A4C):   61.2 ml 26.07 ml/m LA Biplane Vol: 67.5 ml 28.75 ml/m AORTIC VALVE LVOT Vmax:   116.00 cm/s LVOT Vmean:  76.200 cm/s LVOT VTI:    0.252 m  AORTA Ao Root diam: 3.30 cm Ao Asc diam:  4.10 cm  MITRAL VALVE               TRICUSPID VALVE MV Area (PHT): 4.39 cm    TR Peak grad:   8.5 mmHg MV Decel Time: 173 msec    TR Vmax:        146.00 cm/s MV E velocity: 92.00 cm/s MV A velocity: 60.90 cm/s  SHUNTS MV E/A ratio:  1.51        Systemic VTI:  0.25 m Systemic Diam: 2.30 cm  Chilton Si MD Electronically signed by Chilton Si MD Signature Date/Time: 02/01/2023/11:33:06 PM    Final   TEE  ECHO TEE 12/08/2022  Narrative TRANSESOPHOGEAL ECHO REPORT    Patient Name:   LAITYN HEINZERLING Date of Exam: 12/08/2022 Medical Rec #:  [id number]       Height:       64.0 in Accession #:    [id number]      Weight:       314.2 lb Date of Birth:  December 17, 1955        BSA:          2.370 m Patient Age:    66 years        BP:           132/60 mmHg Patient Gender: F               HR:           121 bpm. Exam Location:  Inpatient  Procedure: Transesophageal Echo, Color Doppler and Cardiac Doppler  Indications:     I48.91* Unspeicified atrial fibrillation  History:         Patient has no prior history of Echocardiogram examinations. Signs/Symptoms:Shortness of Breath; Risk Factors:Hypertension, Sleep Apnea and GERD.  Sonographer:      Eulah Pont RDCS Referring Phys:  Donato Schultz, C Diagnosing Phys: Dietrich Pates MD  PROCEDURE: After discussion of the risks and benefits of a TEE, an informed consent was obtained from the patient. The transesophogeal probe was passed without difficulty through the esophogus of the patient. Sedation performed by different physician. The patient was monitored while under deep sedation. Anesthestetic sedation was provided intravenously by Anesthesiology: 272.36mg  of Propofol. The patient's vital signs; including heart rate, blood pressure, and oxygen saturation; remained stable throughout the procedure. The patient developed no complications during the procedure.  IMPRESSIONS   1. Difficult to accurately assess LVEF give angles, elevated HR . Overall LVEF appears moderately depressed Consider TTE when HR / rhythm improved. 2. Right ventricular systolic function is mildly reduced. The right ventricular size is normal. 3. No left atrial/left atrial appendage thrombus was detected. 4. The mitral valve is normal in structure. Mild mitral valve regurgitation. 5. The aortic valve is tricuspid. Aortic valve regurgitation is trivial. Aortic valve sclerosis is present, with no evidence of aortic valve stenosis. 6. There is mild (Grade II) plaque.  FINDINGS Left Ventricle: Difficult to accurately assess LVEF give angles, elevated HR . Overall LVEF appears moderately depressed Consider TTE when HR / rhythm improved. The left ventricular internal cavity size was normal in size.  Right Ventricle: The right ventricular size is normal. Right vetricular wall thickness was not assessed. Right ventricular systolic function is mildly reduced.  Left Atrium: Left atrial size was normal in size. No left atrial/left atrial appendage thrombus was detected.  Right Atrium: Right atrial size was normal in size.  Pericardium: There is no evidence of pericardial effusion.  Mitral Valve: The mitral valve is normal  in structure. Mild mitral valve regurgitation.  Tricuspid Valve: The tricuspid valve is normal in structure. Tricuspid valve regurgitation is mild.  Aortic Valve: The aortic valve is tricuspid. Aortic valve regurgitation is trivial. Aortic valve sclerosis is present, with no evidence of aortic valve stenosis.  Pulmonic Valve: The pulmonic valve was normal in structure. Pulmonic valve regurgitation is trivial.  Aorta: The aortic root is normal in size and structure. There is mild (Grade II) plaque.  IAS/Shunts: No PFO by color doppler.  Additional Comments: Spectral Doppler performed.  Dietrich Pates MD Electronically signed by Dietrich Pates MD Signature Date/Time: 12/08/2022/1:41:53 PM    Final             EKG:  No new  Recent Labs: 12/08/2022: BUN 15; Creatinine, Ser 0.60; Hemoglobin 16.0; Potassium 3.9; Sodium 141  Recent Lipid Panel    Component Value Date/Time   CHOL 142 06/22/2021 0913   TRIG 217 (H) 06/22/2021 0913   HDL 43 06/22/2021 0913   LDLCALC 64 06/22/2021 0913     Risk Assessment/Calculations:               Physical Exam:    VS:  BP 122/72   Pulse 73   Ht 5\' 4"  (1.626 m)   Wt (!) 309 lb 12.8 oz (140.5 kg)   SpO2 97%   BMI 53.18 kg/m     Wt Readings from Last 3 Encounters:  04/21/23 (!) 309 lb 12.8 oz (140.5 kg)  01/05/23 (!) 307 lb 3.2 oz (139.3 kg)  12/08/22 Marland Kitchen)  309 lb (140.2 kg)     GEN:  Well nourished, well developed in no acute distress HEENT: Normal NECK: No JVD; No carotid bruits LYMPHATICS: No lymphadenopathy CARDIAC: RRR, no murmurs, rubs, gallops RESPIRATORY:  Clear to auscultation without rales, wheezing or rhonchi  ABDOMEN: Soft, non-tender, non-distended MUSCULOSKELETAL:  No edema; No deformity  SKIN: Warm and dry NEUROLOGIC:  Alert and oriented x 3 PSYCHIATRIC:  Normal affect   ASSESSMENT:    1. Typical atrial flutter ([name]   2. Mild ascending aorta dilatation ([name]   3. Chronic anticoagulation    PLAN:    In order  of problems listed above:  Paroxysmal atrial flutter typical - Post cardioversion.  Doing well.  January 2024.  Echocardiogram March 2024 showed normal ejection fraction.  Excellent.  Hyperlipidemia - Continue with pravastatin 20 mg.  LDL 64  Chronic anticoagulation - Continue with Eliquis creatinine 0.6 hemoglobin 16          Medication Adjustments/Labs and Tests Ordered: Current medicines are reviewed at length with the patient today.  Concerns regarding medicines are outlined above.  No orders of the defined types were placed in this encounter.  No orders of the defined types were placed in this encounter.   Patient Instructions  Medication Instructions:  The current medical regimen is effective;  continue present plan and medications.  *If you need a refill on your cardiac medications before your next appointment, please call your pharmacy*  Follow-Up: At [facility], you and your health needs are our priority.  As part of our continuing mission to provide you with exceptional heart care, we have created designated Provider Care Teams.  These Care Teams include your primary Cardiologist (physician) and Advanced Practice Providers (APPs -  Physician Assistants and Nurse Practitioners) who all work together to provide you with the care you need, when you need it.  We recommend signing up for the patient portal called "MyChart".  Sign up information is provided on this After Visit Summary.  MyChart is used to connect with patients for Virtual Visits (Telemedicine).  Patients are able to view lab/test results, encounter notes, upcoming appointments, etc.  Non-urgent messages can be sent to your provider as well.   To learn more about what you can do with MyChart, go to [email or web address].    Your next appointment:   1 year(s)  Provider:   Donato Schultz, MD        Signed, Donato Schultz, MD  04/21/2023 3:32 PM    [facility]

## 2023-04-21 NOTE — Patient Instructions (Signed)
Medication Instructions:  The current medical regimen is effective;  continue present plan and medications.  *If you need a refill on your cardiac medications before your next appointment, please call your pharmacy*  Follow-Up: At [facility], you and your health needs are our priority.  As part of our continuing mission to provide you with exceptional heart care, we have created designated Provider Care Teams.  These Care Teams include your primary Cardiologist (physician) and Advanced Practice Providers (APPs -  Physician Assistants and Nurse Practitioners) who all work together to provide you with the care you need, when you need it.  We recommend signing up for the patient portal called "MyChart".  Sign up information is provided on this After Visit Summary.  MyChart is used to connect with patients for Virtual Visits (Telemedicine).  Patients are able to view lab/test results, encounter notes, upcoming appointments, etc.  Non-urgent messages can be sent to your provider as well.   To learn more about what you can do with MyChart, go to [email or web address].    Your next appointment:   1 year(s)  Provider:   Mark Skains, MD      

## 2023-04-25 ENCOUNTER — Ambulatory Visit
Admission: RE | Admit: 2023-04-25 | Discharge: 2023-04-25 | Disposition: A | Discharge status: Home / Self Care | Payer: Medicare Other | Source: Ambulatory Visit | Attending: Family Medicine | Admitting: Family Medicine

## 2023-04-25 DIAGNOSIS — Z1231 Encounter for screening mammogram for malignant neoplasm of breast: Secondary | ICD-10-CM | POA: Insufficient documentation

## 2023-05-16 DIAGNOSIS — E785 Hyperlipidemia, unspecified: Secondary | ICD-10-CM | POA: Insufficient documentation

## 2023-07-17 ENCOUNTER — Ambulatory Visit: Payer: Medicare Other | Admitting: Podiatry

## 2023-08-16 ENCOUNTER — Ambulatory Visit (INDEPENDENT_AMBULATORY_CARE_PROVIDER_SITE_OTHER): Payer: Medicare Other | Admitting: Podiatry

## 2023-08-16 ENCOUNTER — Encounter: Payer: Self-pay | Admitting: Podiatry

## 2023-08-16 DIAGNOSIS — S86112A Strain of other muscle(s) and tendon(s) of posterior muscle group at lower leg level, left leg, initial encounter: Secondary | ICD-10-CM

## 2023-08-16 DIAGNOSIS — S93422A Sprain of deltoid ligament of left ankle, initial encounter: Secondary | ICD-10-CM

## 2023-08-16 NOTE — Progress Notes (Signed)
She presents today chief complaint of pain to her medial ankle left foot.  She states that has been having a lot of pain and swelling in it in the side of the ankle as she points to the medial aspect.  She says some of the pain even radiates up to the top of the foot and in the leg.  Relates a fall that could have injured this nearly a year ago now.  She states that she is tried ice anti-inflammatories steroid all nonsteroidal and immobilization as well as physical therapy.  Objective: Vital signs are stable she is alert and oriented x 3 pulses are palpable.  She has venous insufficiency of the bilateral legs with swelling in the medial ankle overlying the posterior tibial tendon.  Posterior tibial tendon is basically nonfunctional she has no ability to invert I whatsoever she does have some dorsiflexion with minimal inversion due to the tibialis anterior tendon.  She has good plantarflexion and abduction.  Reviewed previous radiographs.  Assessment: Posterior tibial tendinitis/posterior tibial tendon rupture possible tear of the tibialis anterior tendon and swelling of the medial ankle cannot rule out a deltoid injury as well.  Plan: Discussed etiology pathology conservative surgical therapies at this point were going request MRI left rear foot for better visualization of the posterior tibial tendon possible tear.  Cannot rule out other internal derangement this will be considered for surgical intervention as well as differential diagnoses.

## 2023-08-23 ENCOUNTER — Ambulatory Visit
Admission: RE | Admit: 2023-08-23 | Discharge: 2023-08-23 | Disposition: A | Discharge status: Home / Self Care | Payer: Medicare Other | Source: Ambulatory Visit | Attending: Podiatry | Admitting: Podiatry

## 2023-08-23 DIAGNOSIS — S93422A Sprain of deltoid ligament of left ankle, initial encounter: Secondary | ICD-10-CM

## 2023-08-23 DIAGNOSIS — S86112A Strain of other muscle(s) and tendon(s) of posterior muscle group at lower leg level, left leg, initial encounter: Secondary | ICD-10-CM

## 2023-08-28 ENCOUNTER — Ambulatory Visit: Payer: Medicare Other | Admitting: Podiatry

## 2023-08-30 ENCOUNTER — Encounter: Payer: Self-pay | Admitting: Podiatry

## 2023-09-20 ENCOUNTER — Ambulatory Visit (INDEPENDENT_AMBULATORY_CARE_PROVIDER_SITE_OTHER): Payer: Medicare Other | Admitting: Podiatry

## 2023-09-20 DIAGNOSIS — M66872 Spontaneous rupture of other tendons, left ankle and foot: Secondary | ICD-10-CM

## 2023-09-20 NOTE — Progress Notes (Signed)
She presents today states that her left ankle is no better.  Objective: Vital signs are stable she is alert and oriented x 3.  Pulses are palpable.  She still has pain on palpation of the posterior tibial tendon left.  MRI does demonstrate complete tear posterior tibial tendon left foot at the navicular tuberosity.  This is resulting in her flatfoot deformity and pain.  Assessment: Posterior tibial tendon rupture.  Plan: Discussed etiology pathology conservative surgical therapies at this point we will schedule her with physical therapy to strengthen the muscles on the medial aspect of that leg and foot.  We are also going to have her custom fitted for a Richie brace left foot and leg.  If this fails to alleviate her symptoms then we would consider a a Engineer, agricultural and a flexor hallucis longus tendon transfer.

## 2023-12-13 ENCOUNTER — Ambulatory Visit: Payer: Medicare Other | Admitting: Podiatry

## 2023-12-14 ENCOUNTER — Encounter: Payer: Self-pay | Admitting: Cardiology

## 2023-12-15 ENCOUNTER — Other Ambulatory Visit: Payer: Self-pay

## 2023-12-15 MED ORDER — APIXABAN 5 MG PO TABS: 5.0000 mg | ORAL_TABLET | Freq: Two times a day (BID) | ORAL | 1 refills | Status: DC

## 2023-12-15 NOTE — Telephone Encounter (Signed)
Prescription refill request for Eliquis received. Indication:afib Last office visit:5/24   Scr:0.6  1/25 Age: 68 Weight:140.5  kg  Prescription refilled

## 2023-12-27 ENCOUNTER — Ambulatory Visit (INDEPENDENT_AMBULATORY_CARE_PROVIDER_SITE_OTHER): Payer: Medicare HMO | Admitting: Podiatry

## 2023-12-27 ENCOUNTER — Encounter: Payer: Self-pay | Admitting: Podiatry

## 2023-12-27 DIAGNOSIS — M66872 Spontaneous rupture of other tendons, left ankle and foot: Secondary | ICD-10-CM

## 2023-12-27 NOTE — Progress Notes (Signed)
 She presents today for follow-up of her rupture of her posterior tibial tendon of her left foot.  She states that physical therapy may have helped a little bit by increasing the strength in the medial aspect of that foot but all in all it really has not done that much to help it.  She also states that the Richie brace that was made caused blisters on her feet and legs.  She states it really does not bother me all the time so may just not do anything about it.  Objective: Vital signs are stable she is alert oriented x 3 reviewed her past medical history medications allergies surgery social history reviewed her previous physical exam consistent with today's exam of a rupture posterior tibial tendon.  Assessment: Rupture posterior tibial tendon pes planovalgus.  Edema bilateral lower extremity.  Plan: I am going to have her see Lolita to discuss appropriate shoe gear with her and also see about casting her for flatfoot type of orthotic.  I think it would be more of like a [place] device with a deep heel counter and a flared navicular to help bring her heel back under her foot.

## 2024-01-15 ENCOUNTER — Encounter: Payer: Self-pay | Admitting: Cardiology

## 2024-01-20 ENCOUNTER — Encounter: Payer: Self-pay | Admitting: Cardiology

## 2024-01-22 ENCOUNTER — Ambulatory Visit: Payer: Medicare HMO

## 2024-01-22 DIAGNOSIS — M722 Plantar fascial fibromatosis: Secondary | ICD-10-CM

## 2024-01-22 DIAGNOSIS — M2141 Flat foot [pes planus] (acquired), right foot: Secondary | ICD-10-CM

## 2024-01-22 DIAGNOSIS — S86112A Strain of other muscle(s) and tendon(s) of posterior muscle group at lower leg level, left leg, initial encounter: Secondary | ICD-10-CM

## 2024-01-22 DIAGNOSIS — M66872 Spontaneous rupture of other tendons, left ankle and foot: Secondary | ICD-10-CM

## 2024-01-22 DIAGNOSIS — S93422A Sprain of deltoid ligament of left ankle, initial encounter: Secondary | ICD-10-CM

## 2024-01-23 NOTE — Progress Notes (Signed)
 Orthotics   Patient was present and evaluated for Custom molded foot orthotics. Patient will benefit from CFO's to provide total contact to BIL MLA's helping to balance and distribute body weight more evenly across BIL feet helping to reduce plantar pressure and pain. Orthotic will also encourage FF / RF alignment  Patient was scanned today and will return for fitting upon receipt   Prior auth needed from [facility] and confirmed today item will be ordered if approved if denied will call patient before ordering `  `

## 2024-01-26 ENCOUNTER — Ambulatory Visit
Admission: RE | Admit: 2024-01-26 | Discharge: 2024-01-26 | Disposition: A | Discharge status: Home / Self Care | Payer: Medicare Other | Source: Ambulatory Visit | Attending: Nurse Practitioner | Admitting: Nurse Practitioner

## 2024-01-26 DIAGNOSIS — I77810 Thoracic aortic ectasia: Secondary | ICD-10-CM | POA: Diagnosis present

## 2024-01-26 LAB — POCT I-STAT CREATININE: Creatinine, Ser: 0.8 mg/dL (ref 0.44–1.00)

## 2024-01-26 MED ORDER — IOHEXOL 350 MG/ML SOLN
75.0000 mL | Freq: Once | INTRAVENOUS | Status: AC | PRN
  Administered 2024-01-26: 75 mL via INTRAVENOUS

## 2024-02-15 ENCOUNTER — Ambulatory Visit (INDEPENDENT_AMBULATORY_CARE_PROVIDER_SITE_OTHER)

## 2024-02-15 DIAGNOSIS — M722 Plantar fascial fibromatosis: Secondary | ICD-10-CM

## 2024-02-15 DIAGNOSIS — M2142 Flat foot [pes planus] (acquired), left foot: Secondary | ICD-10-CM

## 2024-02-15 DIAGNOSIS — M2141 Flat foot [pes planus] (acquired), right foot: Secondary | ICD-10-CM | POA: Diagnosis not present

## 2024-04-22 ENCOUNTER — Ambulatory Visit: Payer: Medicare Other | Admitting: Cardiology

## 2024-04-26 ENCOUNTER — Telehealth: Payer: Self-pay | Admitting: Podiatry

## 2024-04-26 NOTE — Telephone Encounter (Signed)
 Pt states she has been waiting sometime on orthotics. Please call with update. TY

## 2024-05-20 ENCOUNTER — Encounter: Payer: Self-pay | Admitting: Cardiology

## 2024-05-20 ENCOUNTER — Ambulatory Visit: Attending: Cardiology | Admitting: Cardiology

## 2024-05-20 VITALS — BP 130/80 | HR 73 | Resp 16 | Ht 64.0 in | Wt 313.6 lb

## 2024-05-20 DIAGNOSIS — I10 Essential (primary) hypertension: Secondary | ICD-10-CM

## 2024-05-20 DIAGNOSIS — Z7901 Long term (current) use of anticoagulants: Secondary | ICD-10-CM

## 2024-05-20 DIAGNOSIS — I483 Typical atrial flutter: Secondary | ICD-10-CM

## 2024-05-20 NOTE — Patient Instructions (Signed)

## 2024-05-20 NOTE — Progress Notes (Signed)
 Cardiology Office Note:  .   Date:  05/20/2024  ID:  [name], DOB July 10, 1956, MRN [id number] PCP: Alla Amis, MD  [facility] Providers Cardiologist:  Oneil Parchment, MD     History of Present Illness: .   [name] is a 68 y.o. female Discussed the use of AI scribe software for clinical note transcription with the patient, who gave verbal consent to proceed.  History of Present Illness [name] is a 68 year old female with atrial flutter who presents for a follow-up visit.  She has a history of atrial flutter, initially noted with a heart rate of 131 beats per minute. She has been on atenolol  100 mg and diltiazem 120 mg, and was previously on warfarin due to the cost of Eliquis . She underwent a T cardioversion, after which her ejection fraction was moderately depressed. She has experienced atrial flutter symptoms since high school, particularly when lying down.  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 She is currently on Eliquis  5 mg twice a day. She does not feel the flutter as much as before, only occasionally.  She also has hyperlipidemia, managed with pravastatin 20 mg, with an LDL of 64.  Her current medications include atenolol  100 mg, diltiazem 120 mg, lisinopril /hydrochlorothiazide  20/12.5 mg, hydralazine 25 mg at bedtime, and Lasix  20 mg as needed. She mentions that Eliquis  is expensive, costing about $400 for three months, and she is considering changing insurance in October to potentially reduce costs.  No significant symptoms related to atrial flutter are currently noted, and her EKG shows normal rhythm.   Studies Reviewed: SABRA   EKG Interpretation Date/Time:  Monday May 20 2024 13:32:23 EDT Ventricular Rate:  73 PR Interval:  206 QRS Duration:  94 QT Interval:  412 QTC Calculation: 453 R Axis:   39  Text Interpretation: Normal sinus rhythm Normal ECG When compared with  ECG of 08-Dec-2022 12:52, No significant change was found Confirmed by Parchment Oneil ([id number]) on 05/20/2024 1:36:53 PM    Results LABS LDL: 64  RADIOLOGY CT aorta: Aorta 38 mm maximum, no signs of thrombus (01/26/2024)  DIAGNOSTIC Echocardiogram: Normal ejection fraction, normal pump function, no thrombus (02/01/2023) EKG: Normal sinus rhythm (05/20/2024) Risk Assessment/Calculations:            Physical Exam:   VS:  BP 130/80 (BP Location: Left Arm, Patient Position: Sitting, Cuff Size: Large)   Pulse 73   Resp 16   Ht 5' 4 (1.626 m)   Wt (!) 313 lb 9.6 oz (142.2 kg)   SpO2 95%   BMI 53.83 kg/m    Wt Readings from Last 3 Encounters:  05/20/24 (!) 313 lb 9.6 oz (142.2 kg)  04/21/23 (!) 309 lb 12.8 oz (140.5 kg)  01/05/23 (!) 307 lb 3.2 oz (139.3 kg)    GEN: Well nourished, well developed in no acute distress NECK: No JVD; No carotid bruits CARDIAC: RRR, no murmurs, no rubs, no gallops RESPIRATORY:  Clear to auscultation without rales, wheezing or rhonchi  ABDOMEN: Soft, non-tender, non-distended EXTREMITIES:  No edema; No deformity   ASSESSMENT AND PLAN: .    Assessment and Plan Assessment & Plan Atrial flutter Atrial flutter is well-controlled with current medication regimen. Occasional flutter episodes reported, but significantly improved since cardioversion. EKG shows normal rhythm. - Continue atenolol  100 mg daily. - Continue diltiazem 120 mg daily.  Chronic anticoagulation therapy Currently on Eliquis  5  mg BID for anticoagulation. Cost of medication is a concern, but discontinuation poses risk of stroke if atrial flutter recurs. Discussed alternative options like the Watchman device, but she prefers to continue current therapy. - Continue Eliquis  5 mg BID. - Monitor blood count and kidney function regularly.  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 Recent CT scan indicates aorta is within normal  range at 38 mm, with no signs of blood clot. - No further imaging required at this time.  Hyperlipidemia Hyperlipidemia is well-managed with pravastatin 20 mg. LDL is at 64 mg/dL. - Continue pravastatin 20 mg daily.         Dispo: 1 yr  Signed, Oneil Parchment, MD

## 2024-05-28 ENCOUNTER — Telehealth: Payer: Self-pay | Admitting: Podiatry

## 2024-05-28 NOTE — Telephone Encounter (Signed)
 LVM to schedule orthotic pick up

## 2024-06-27 ENCOUNTER — Other Ambulatory Visit: Payer: Self-pay | Admitting: Family Medicine

## 2024-06-27 DIAGNOSIS — Z1231 Encounter for screening mammogram for malignant neoplasm of breast: Secondary | ICD-10-CM

## 2024-06-28 ENCOUNTER — Ambulatory Visit
Admission: RE | Admit: 2024-06-28 | Discharge: 2024-06-28 | Disposition: A | Discharge status: Home / Self Care | Source: Ambulatory Visit | Attending: Family Medicine | Admitting: Family Medicine

## 2024-06-28 DIAGNOSIS — Z1231 Encounter for screening mammogram for malignant neoplasm of breast: Secondary | ICD-10-CM | POA: Insufficient documentation

## 2024-07-15 ENCOUNTER — Ambulatory Visit

## 2024-07-15 NOTE — Progress Notes (Signed)
 Patient was in and fit with [facility] style orthotics No charge as I had ordered wrong style of orthotic to begin with However, fit is great patient is very happy and will call with any questions or concerns as they arise  Lolita Schultze Cped, CFo, CFm

## 2024-07-16 ENCOUNTER — Other Ambulatory Visit: Payer: Self-pay | Admitting: Cardiology

## 2024-07-16 DIAGNOSIS — I483 Typical atrial flutter: Secondary | ICD-10-CM

## 2024-07-16 NOTE — Telephone Encounter (Signed)
 Eliquis  5mg  refill request received. Patient is 68 years old, weight-142.2kg, Crea-0.78 on 01/26/24, Diagnosis-Aflutter, and last seen by Dr. Jeffrie on 05/20/24. Dose is appropriate based on dosing criteria. Will send in refill to requested pharmacy.

## 2024-08-05 ENCOUNTER — Ambulatory Visit: Admitting: Cardiology
